# Patient Record
Sex: Male | Born: 1955 | Race: White | Hispanic: No | Marital: Married | Smoking: Never smoker
Health system: Southern US, Community
[De-identification: ages and names within clinical notes are randomized; demographics above are authoritative.]

## PROBLEM LIST (undated history)

## (undated) DIAGNOSIS — T7840XA Allergy, unspecified, initial encounter: Secondary | ICD-10-CM

## (undated) DIAGNOSIS — G4730 Sleep apnea, unspecified: Secondary | ICD-10-CM

## (undated) DIAGNOSIS — K219 Gastro-esophageal reflux disease without esophagitis: Secondary | ICD-10-CM

## (undated) HISTORY — DX: Sleep apnea, unspecified: G47.30

## (undated) HISTORY — DX: Gastro-esophageal reflux disease without esophagitis: K21.9

## (undated) HISTORY — PX: FRACTURE SURGERY: SHX138

## (undated) HISTORY — PX: VASCULAR SURGERY: SHX849

## (undated) HISTORY — DX: Allergy, unspecified, initial encounter: T78.40XA

## (undated) HISTORY — PX: OTHER SURGICAL HISTORY: SHX169

## (undated) HISTORY — PX: HERNIA REPAIR: SHX51

---

## 2005-01-03 HISTORY — PX: LEFT HEART CATH AND CORONARY ANGIOGRAPHY: CATH118249

## 2005-01-15 ENCOUNTER — Ambulatory Visit (HOSPITAL_COMMUNITY): Admission: RE | Admit: 2005-01-15 | Discharge: 2005-01-15 | Payer: Self-pay | Admitting: *Deleted

## 2005-09-02 ENCOUNTER — Encounter: Admission: RE | Admit: 2005-09-02 | Discharge: 2005-09-02 | Payer: Self-pay | Admitting: Urology

## 2005-09-16 ENCOUNTER — Ambulatory Visit (HOSPITAL_COMMUNITY): Admission: RE | Admit: 2005-09-16 | Discharge: 2005-09-16 | Payer: Self-pay | Admitting: Interventional Radiology

## 2005-10-15 ENCOUNTER — Encounter: Admission: RE | Admit: 2005-10-15 | Discharge: 2005-10-15 | Payer: Self-pay | Admitting: Interventional Radiology

## 2006-04-29 ENCOUNTER — Ambulatory Visit: Payer: Self-pay | Admitting: Family Medicine

## 2006-05-27 ENCOUNTER — Ambulatory Visit: Payer: Self-pay | Admitting: Family Medicine

## 2006-06-04 ENCOUNTER — Ambulatory Visit: Payer: Self-pay | Admitting: Family Medicine

## 2008-03-27 ENCOUNTER — Ambulatory Visit: Payer: Self-pay | Admitting: Internal Medicine

## 2008-03-27 DIAGNOSIS — I861 Scrotal varices: Secondary | ICD-10-CM

## 2008-03-27 DIAGNOSIS — D239 Other benign neoplasm of skin, unspecified: Secondary | ICD-10-CM | POA: Insufficient documentation

## 2008-03-28 ENCOUNTER — Ambulatory Visit: Payer: Self-pay | Admitting: Internal Medicine

## 2008-04-02 LAB — CONVERTED CEMR LAB
BUN: 16 mg/dL (ref 6–23)
Basophils Relative: 0.4 % (ref 0.0–1.0)
CO2: 30 meq/L (ref 19–32)
Calcium: 9.4 mg/dL (ref 8.4–10.5)
Chloride: 101 meq/L (ref 96–112)
Creatinine, Ser: 0.9 mg/dL (ref 0.4–1.5)
Eosinophils Relative: 5.6 % — ABNORMAL HIGH (ref 0.0–5.0)
Glucose, Bld: 102 mg/dL — ABNORMAL HIGH (ref 70–99)
Hemoglobin: 14.9 g/dL (ref 13.0–17.0)
Lymphocytes Relative: 34.1 % (ref 12.0–46.0)
MCHC: 35.1 g/dL (ref 30.0–36.0)
Monocytes Relative: 11.4 % (ref 3.0–12.0)
Neutro Abs: 2.4 10*3/uL (ref 1.4–7.7)
PSA: 0.54 ng/mL (ref 0.10–4.00)
RBC: 4.51 M/uL (ref 4.22–5.81)
TSH: 0.63 microintl units/mL (ref 0.35–5.50)
Total CHOL/HDL Ratio: 5
VLDL: 14 mg/dL (ref 0–40)
WBC: 5 10*3/uL (ref 4.5–10.5)

## 2011-02-20 NOTE — Cardiovascular Report (Signed)
NAME:  JEFFREN, DOMBEK NO.:  [id number]   MEDICAL RECORD NO.:  [id number]          PATIENT TYPE:  OIB   LOCATION:  2899                         FACILITY:  [facility]   PHYSICIAN:  Meade Maw, M.D.    DATE OF BIRTH:  09/17/1956   DATE OF PROCEDURE:  DATE OF DISCHARGE:                              CARDIAC CATHETERIZATION   REFERRING PHYSICIAN:  Leanne Chang, M.D.   PROCEDURE:  Cardiac catheterization.   INDICATION FOR PROCEDURE:  A 55 year old gentleman who presents for an  annual physical exam and was noted to have significant dizziness with any  physical activity.  Stress Cardiolite was performed and this revealed  changes consistent with inferior apical ischemia and an ejection fraction of  58%.  He was also noted to have ST changes consistent with ischemia on  stress test.   DESCRIPTION OF PROCEDURE:  After obtaining written informed consent, the  patient was brought to the cardiac catheterization lab in a post-absorptive  state.  Preop sedation was achieved, given Versed 2 mg IV, fentanyl 50 mcg  IV and the right groin was prepped and draped in the usual sterile fashion.  Local anesthesia was achieved using 1% Xylocaine.  A 6-French hemostasis  sheath was placed into the right femoral artery using the modified Seldinger  technique.  Selective coronary angiography was performed using a JL4, JR4  and Judkins catheter and multiple views were obtained.  All catheter  exchanges were made over a guidewire.  Single plane ventriculogram was  performed in the RAO position using the 6 French pigtail curved catheter.  There was no identifiable disease, the patient was transferred to the  holding area and good hemostasis was achieved using digital pressure.  There  were no immediate complications.   FINDINGS:  The aortic pressure was 132/72, LV pressure was 132/7, EDP was  12.  Single plane ventriculogram revealed normal wall motion, ejection  fraction of 65%.   Coronary angiography:  The left main coronary artery bifurcates into the  left anterior descending and circumflex vessel.  There is no significant  disease noted in the left main coronary artery.   Left anterior descending:  The left anterior descending gives rise to a  large D1 and small D2, goes on as an apical branch, there is no disease  noted in the left anterior descending or its branches.   Circumflex vessel:  The circumflex vessel is a moderate size vessel and  gives rise to trivial OM1 and OM2, large OM3, ends as an  AV groove vessel.  There is no disease noted in the circumflex or its branches.   Right coronary artery:  The right coronary artery is a large artery,  dominant for the posterior circulation and gives rise to two RV marginals,  PDA and a trifurcating PL branch.  There is no disease noted in the right  coronary artery or its branches.   FINAL IMPRESSION:  1.  Normal single plane ventriculogram, ejection fraction 65%.  2.  Normal coronary angiography.  3.  False/positive stress Cardiolite.  4.  Other etiologies for his dizziness should be ascertained.  HP/MEDQ  D:  01/15/2005  T:  01/15/2005  Job:  [id number]   cc:   Leanne Chang, M.D.  [address]  [place]  [place] [address]  Fax: [phone]

## 2013-12-26 ENCOUNTER — Other Ambulatory Visit: Payer: Self-pay | Admitting: Gastroenterology

## 2019-03-28 ENCOUNTER — Encounter (HOSPITAL_COMMUNITY): Payer: Self-pay | Admitting: Emergency Medicine

## 2019-03-28 ENCOUNTER — Emergency Department (HOSPITAL_COMMUNITY): Payer: Self-pay

## 2019-03-28 ENCOUNTER — Other Ambulatory Visit: Payer: Self-pay

## 2019-03-28 ENCOUNTER — Emergency Department (HOSPITAL_COMMUNITY)
Admission: EM | Admit: 2019-03-28 | Discharge: 2019-03-28 | Disposition: A | Discharge status: Home / Self Care | Payer: Self-pay | Attending: Emergency Medicine | Admitting: Emergency Medicine

## 2019-03-28 DIAGNOSIS — Z7982 Long term (current) use of aspirin: Secondary | ICD-10-CM | POA: Insufficient documentation

## 2019-03-28 DIAGNOSIS — R079 Chest pain, unspecified: Secondary | ICD-10-CM | POA: Insufficient documentation

## 2019-03-28 LAB — CBC
HCT: 44.9 % (ref 39.0–52.0)
Hemoglobin: 15.5 g/dL (ref 13.0–17.0)
MCH: 31.4 pg (ref 26.0–34.0)
MCHC: 34.5 g/dL (ref 30.0–36.0)
MCV: 91.1 fL (ref 80.0–100.0)
Platelets: 215 10*3/uL (ref 150–400)
RBC: 4.93 MIL/uL (ref 4.22–5.81)
RDW: 12.3 % (ref 11.5–15.5)
WBC: 5.8 10*3/uL (ref 4.0–10.5)
nRBC: 0 % (ref 0.0–0.2)

## 2019-03-28 LAB — BASIC METABOLIC PANEL
Anion gap: 8 (ref 5–15)
BUN: 16 mg/dL (ref 8–23)
CO2: 28 mmol/L (ref 22–32)
Calcium: 9.7 mg/dL (ref 8.9–10.3)
Chloride: 104 mmol/L (ref 98–111)
Creatinine, Ser: 1.13 mg/dL (ref 0.61–1.24)
GFR calc Af Amer: >60 mL/min (ref >60)
GFR calc non Af Amer: >60 mL/min (ref >60)
Glucose, Bld: 95 mg/dL (ref 70–99)
Potassium: 4.2 mmol/L (ref 3.5–5.1)
Sodium: 140 mmol/L (ref 135–145)

## 2019-03-28 LAB — TROPONIN I (HIGH SENSITIVITY)
Troponin I (High Sensitivity): 5 ng/L (ref <18)
Troponin I (High Sensitivity): 6 ng/L (ref <18)

## 2019-03-28 MED ORDER — SODIUM CHLORIDE 0.9% FLUSH: 3.0000 mL | Freq: Once | INTRAVENOUS | Status: DC

## 2019-03-28 NOTE — ED Notes (Signed)
Patient reports chest pain/pressure intermittent with varying degrees of pressure or pain, sometimes accompanied by lightheadedness.  Rest alleviates his pain, activity can increase is.  This has been going on for about a month or so.  His dr referred him to the ER for assessment

## 2019-03-28 NOTE — Discharge Instructions (Addendum)
The testing today does not show any severe problems.  Make sure you are getting plenty of rest, eating regularly and try to relax.  If your symptoms worsen, you can always return here for reevaluation.  Otherwise, follow-up with Dr. Ellyn Hack, next week, as scheduled.  He may want to investigate further with a stress test or other type of cardiac evaluation.

## 2019-03-28 NOTE — ED Notes (Signed)
Patient verbalizes understanding of discharge instructions. Opportunity for questioning and answers were provided. Armband removed by staff, pt discharged from ED.  

## 2019-03-28 NOTE — ED Provider Notes (Signed)
[facility] EMERGENCY DEPARTMENT Provider Note   CSN: [id number] Arrival date & time: 03/28/19  1003    History   Chief Complaint Chief Complaint  Patient presents with  . Chest Pain  . Back Pain    HPI Philip Crawford is a 63 y.o. male.     HPI   The patient is here because of a personal concern for cardiac problems manifested by intermittent episodes of pain in neck, chest, left shoulder and left arm.  He is also had multiple episodes of near syncope without frank syncope.  He had a single episode of dyspnea with exertion when climbing on a trail which was steep and the weather was hot.  He does not think he had chest pain when he was having the trouble breathing while walking.  No ongoing shortness of breath.  None of his symptoms are ongoing.  Symptoms have all been present for about a month.  He restarted his Prilosec a couple weeks ago because of concern for reflux disease recurrence.  He also schedule appointment with his dentist, to make sure the teeth were not a problem.  He scheduled appointment to see a cardiologist has not done that yet.  He states he had a cardiac cath, 15 years ago to follow-up on episodes of near syncope.  The catheterization was normal.  He does not take cardiac medications.  He does not know what his cholesterol is but it has been borderline high in the past, and he has tried to keep it low with diet, and exercise.  Because of the pandemic he has not exercised in the last several months.  He admits to stress, mentioning his daughter's death, 6 months ago.  He also recalls periods of chest discomfort with stressful times, and sometimes taps on his chest, to relieve the discomfort.  He had a fever several weeks ago with some achiness, and had a COVID test, 2 weeks ago which was negative.  No known sick contacts, currently.  He works as a Armed forces operational officer.  There are no other known modifying factors.  History reviewed. No pertinent past medical  history.  Patient Active Problem List   Diagnosis Date Noted  . 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 VASCULAR SURGERY          Home Medications    Prior to Admission medications   Medication Sig Start Date End Date Taking? Authorizing Provider  aspirin EC 81 MG tablet Take 81 mg by mouth daily.   Yes [provider]  omeprazole (PRILOSEC) 20 MG capsule Take 20 mg by mouth daily.   Yes [provider]  Ubiquinol 100 MG CAPS Take 100 mg by mouth daily.   Yes [provider]  VITAMIN D, CHOLECALCIFEROL, PO Take 5,000 Units by mouth daily.   Yes [provider]    Family History No family history on file.  Social History Social History   Tobacco Use  . Smoking status: Never Smoker  . Smokeless tobacco: Never Used  Substance Use Topics  . Alcohol use: Yes  . Drug use: Not Currently     Allergies   Patient has no allergy information on record.   Review of Systems Review of Systems  All other systems reviewed and are negative.    Physical Exam Updated Vital Signs BP 120/73 (BP Location: Right Arm)   Pulse 69   Temp 98.4 F (36.9  C) (Oral)   Resp 16   Ht 5\' 10"  (1.778 m)   Wt 72.6 kg   SpO2 98%   BMI 22.96 kg/m   Physical Exam Vitals signs and nursing note reviewed.  Constitutional:      General: He is in acute distress (Anxious).     Appearance: He is well-developed and normal weight. He is not ill-appearing, toxic-appearing or diaphoretic.  HENT:     Head: Normocephalic and atraumatic.     Right Ear: External ear normal.     Left Ear: External ear normal.     Mouth/Throat:     Mouth: Mucous membranes are moist.  Eyes:     Conjunctiva/sclera: Conjunctivae normal.     Pupils: Pupils are equal, round, and reactive to light.  Neck:     Musculoskeletal: Normal range of motion and neck supple.     Trachea: Phonation normal.  Cardiovascular:      Rate and Rhythm: Normal rate and regular rhythm.     Heart sounds: Normal heart sounds. No murmur. No friction rub.  Pulmonary:     Effort: Pulmonary effort is normal. No respiratory distress.     Breath sounds: Normal breath sounds. No stridor. No rhonchi.  Chest:     Chest wall: No tenderness.  Abdominal:     General: There is no distension.     Palpations: Abdomen is soft. There is no mass.     Tenderness: There is no abdominal tenderness.  Musculoskeletal: Normal range of motion.  Skin:    General: Skin is warm and dry.  Neurological:     Mental Status: He is alert and oriented to person, place, and time.     Cranial Nerves: No cranial nerve deficit.     Sensory: No sensory deficit.     Motor: No abnormal muscle tone.     Coordination: Coordination normal.  Psychiatric:        Mood and Affect: Mood normal.        Behavior: Behavior normal.        Thought Content: Thought content normal.        Judgment: Judgment normal.      ED Treatments / Results  Labs (all labs ordered are listed, but only abnormal results are displayed) Labs Reviewed  BASIC METABOLIC PANEL  CBC  TROPONIN I (HIGH SENSITIVITY)  TROPONIN I (HIGH SENSITIVITY)    EKG EKG Interpretation  Date/Time:  Tuesday March 28 2019 10:09:59 EDT Ventricular Rate:  80 PR Interval:  124 QRS Duration: 90 QT Interval:  358 QTC Calculation: 412 R Axis:   93 Text Interpretation:  Normal sinus rhythm with sinus arrhythmia Rightward axis Borderline ECG No old tracing to compare Confirmed by Daleen Bo [id number]) on 03/28/2019 11:27:35 AM   Radiology Dg Chest 2 View  Result Date: 03/28/2019 CLINICAL DATA:  LEFT side chest pain into LEFT shoulder, back, and arm for 1 month, slight numbness in LEFT arm and hand EXAM: CHEST - 2 VIEW COMPARISON:  01/15/2005 FINDINGS: Normal heart size, mediastinal contours, and pulmonary vascularity. Faint nodular densities from EKG leads. Lungs clear. No acute infiltrate, pleural  effusion or pneumothorax. Bones demineralized. IMPRESSION: No acute abnormalities. Electronically Signed   By: Lavonia Dana M.D.   On: 03/28/2019 10:45    Procedures Procedures (including critical care time)  Medications Ordered in ED Medications  sodium chloride flush (NS) 0.9 % injection 3 mL (has no administration in time range)     Initial Impression / Assessment and  Plan / ED Course  I have reviewed the triage vital signs and the nursing notes.  Pertinent labs & imaging results that were available during my care of the patient were reviewed by me and considered in my medical decision making (see chart for details).  Clinical Course as of Mar 27 1340  Tue Mar 28, 2019  1203 No infiltrate or CHF, images reviewed by me  DG Chest 2 View [EW]  1204 Normal  Troponin I (High Sensitivity) [EW]  1204 Normal  Basic metabolic panel [EW]  3710 Normal  CBC [EW]    Clinical Course User Index [EW] Daleen Bo, MD        Patient Vitals for the past 24 hrs:  BP Temp Temp src Pulse Resp SpO2 Height Weight  03/28/19 1250 120/73 98.4 F (36.9 C) Oral 69 - 98 % - -  03/28/19 1115 - - - - - - 5\' 10"  (1.778 m) 72.6 kg  03/28/19 1017 132/74 98.1 F (36.7 C) Oral 65 16 96 % - -    1:41 PM Reevaluation with update and discussion. After initial assessment and treatment, an updated evaluation reveals no change in clinical status, findings discussed with the patient and all questions were answered. Daleen Bo   Medical Decision Making: Patient with ongoing symptoms for about a month, which are nonspecific in nature and actually multifactorial.  He had screening test for cardiac, pulmonary, and physical examination, all of which are reassuring.  Patient is low risk for cardiac disease.  He has elevated cholesterol level in the past, but it has not been checked recently.  He is overall healthy, and robust.  He has short-term follow-up with cardiology planned for next week.  Doubt ACS, PE,  CVA, TIA, serious bacterial infection or metabolic instability.  There is no indication for further ED evaluation or intervention at this time.  CRITICAL CARE-no Performed by: Daleen Bo  Nursing Notes Reviewed/ Care Coordinated Applicable Imaging Reviewed Interpretation of Laboratory Data incorporated into ED treatment  The patient appears reasonably screened and/or stabilized for discharge and I doubt any other medical condition or other [facility] requiring further screening, evaluation, or treatment in the ED at this time prior to discharge.  Plan: Home Medications-continue usual; Home Treatments-rest, fluids; return here if the recommended treatment, does not improve the symptoms; Recommended follow up-PCP, PRN.  Follow-up with cardiology on 04/03/2019, as scheduled   Final Clinical Impressions(s) / ED Diagnoses   Final diagnoses:  Chest pain, unspecified type    ED Discharge Orders    None       Daleen Bo, MD 03/28/19 1341

## 2019-03-28 NOTE — ED Triage Notes (Signed)
Pt. Stated, I started having chest pain with some back pain it started Memorial weekend and its off and on. Last couple of days its been more . It has been in my shoulder also.

## 2019-03-31 ENCOUNTER — Telehealth: Payer: Self-pay | Admitting: Cardiology

## 2019-03-31 NOTE — Telephone Encounter (Signed)
LVM for pt, reminding him of his appt on 04-03-19 with Dr Ellyn Hack.

## 2019-04-03 ENCOUNTER — Other Ambulatory Visit: Payer: Self-pay

## 2019-04-03 ENCOUNTER — Encounter: Payer: Self-pay | Admitting: Cardiology

## 2019-04-03 ENCOUNTER — Telehealth: Payer: Self-pay | Admitting: *Deleted

## 2019-04-03 ENCOUNTER — Ambulatory Visit (INDEPENDENT_AMBULATORY_CARE_PROVIDER_SITE_OTHER): Payer: PRIVATE HEALTH INSURANCE | Admitting: Cardiology

## 2019-04-03 DIAGNOSIS — R002 Palpitations: Secondary | ICD-10-CM

## 2019-04-03 DIAGNOSIS — R079 Chest pain, unspecified: Secondary | ICD-10-CM | POA: Diagnosis not present

## 2019-04-03 DIAGNOSIS — R55 Syncope and collapse: Secondary | ICD-10-CM | POA: Diagnosis not present

## 2019-04-03 NOTE — Patient Instructions (Addendum)
Medication Instructions:   If you need a refill on your cardiac medications before your next appointment, please call your pharmacy.   Lab work:  If you have labs (blood work) drawn today and your tests are completely normal, you will receive your results only by: Marland Kitchen MyChart Message (if you have MyChart) OR . A paper copy in the mail If you have any lab test that is abnormal or we need to change your treatment, we will call you to review the results.  Testing/Procedures: Will schedule at [facility] has recommended that you wear an event monitor 14 DAYS - ZIO PATCH . Event monitors are medical devices that record the heart's electrical activity. Doctors most often [place] these monitors to diagnose arrhythmias. Arrhythmias are problems with the speed or rhythm of the heartbeat. The monitor is a small, portable device. You can wear one while you do your normal daily activities. This is usually used to diagnose what is causing palpitations/syncope (passing out).  AND  WILL BE SCHEDULE AT [facility] [address] WILL  NEED TO AC OVID TEST PRIOR TO TESTING WILL CALL YOU WHEN NEEDED. Your physician has requested that you have an exercise tolerance test. For further information please visit [email or web address]. Please also follow instruction sheet, as given.    Follow-Up: At [facility], you and your health needs are our priority.  As part of our continuing mission to provide you with exceptional heart care, we have created designated Provider Care Teams.  These Care Teams include your primary Cardiologist (physician) and Advanced Practice Providers (APPs -  Physician Assistants and Nurse Practitioners) who all work together to provide you with the care you need, when you need it. . You will need a follow up appointment in  3 months.  Please call our office 2 months in advance to schedule this appointment.  You may see Glenetta Hew, MD or one of the following  Advanced Practice Providers on your designated Care Team:   . Rosaria Ferries, PA-C . Jory Sims, DNP, ANP  Any Other Special Instructions Will Be Listed Below (If Applicable).

## 2019-04-03 NOTE — Progress Notes (Signed)
PCP: [facility] due to  Clinic Note: No chief complaint on file.   HPI: Philip Crawford is a 63 y.o. male with a PMH below who presents today for ER f/u - Chest pain/palpitations/near syncope evaluation.  Referred by EDP for PCP - Colon Branch, MD.  Rosalyn Charters was has not routinely followed up with his PCP.  Recent Hospitalizations:   ER 6/23: "personal concern for cardiac problems manifested by intermittent episodes of pain in neck, chest, left shoulder and left arm.  He is also had multiple episodes of near syncope without frank syncope.  He had a single episode of dyspnea with exertion when climbing on a trail which was steep and the weather was hot.  He does not think he had chest pain when he was having the trouble breathing while walking.  No ongoing shortness of breath.  None of his symptoms are ongoing.  Symptoms have all been present for about a month."  Studies Personally Reviewed - (if available, images/films reviewed: From Epic Chart or Care Everywhere)  Cardiolite ST 01/2005:  Perfusion defect c/w inferior apical ischemia, EF 58%.  He was also noted to have ST changes consistent with ischemia on stress test.  Cath 01/2005: Normal LV gram.  Normal coronary arteries.  False positive Cardiolite Stress Test  Interval History: Philip Crawford presents here today for cardiology evaluation really explaining/describing the same symptoms noted in the ER visit.  Very haphazard and random symptoms overall.  Is somewhat of a disjointed historian.  The most notable symptom he feels is a sense of jitteriness and dizziness with an irregular heart rate or beat sensation.  He says the heart rate is usually very fast, and also an u 8 nusual heartbeat.  It may or may not be associated with shortness of breath or chest discomfort.  These episodes happen randomly without any particular associates from a many activity.  However he does note that he does not have them when he  exercised at the gym just in the last couple days.  Another symptom he notices a focal area of discomfort reports to the left upper chest heading toward the shoulder that can sometimes go up into the shoulder or around the neck to the back between his shoulder blades.  Very rarely does appear to feel go down his arm as it has been on the shoulder first.  It does not seem to be necessarily associated with any particular activity, can happen at any given time --both at rest and with exertion.  For the most part, he does not have exertional dyspnea, did have a least 1 episode while hiking.  Remainder of cardiovascular review of symptoms as follows: No PND, orthopnea or edema.  No  syncope/near syncope, but he does have some lightheadedness and dizziness associated with the irregular heartbeat sensation.. No TIA/amaurosis fugax symptoms. No melena, hematochezia, hematuria, or epstaxis. No claudication.  He first noted having symptoms back on May 25 where he had a really weird dizzy sensation in excellent time he felt like he may pass out.  Since then he has had occasional lightheadedness and uneasy sensation in his chest.  On June 2 he had some short of breath and and and chest discomfort.  On the fourth he started having another dizzy spell again associated with some jaw tightness and the left arm pain left chest pain.  Several days later he had another bout of dizziness.  He had some sweats and affect fever  symptoms with dyspnea and was concerned about COVID-19.  He actually was tested and found to be negative.  When he was evaluated in the ER he was started on Prilosec and he says he is felt better since then.  He is hoping to see his GI doctor to discuss symptoms.  ROS: A comprehensive was performed. Review of Systems  Constitutional: Positive for fever (One episode noted above). Negative for malaise/fatigue.  HENT: Negative for congestion and nosebleeds.   Respiratory: Positive for shortness of  breath. Negative for cough.        Per HPI  Gastrointestinal: Positive for abdominal pain and heartburn. Negative for blood in stool, melena, nausea and vomiting.       He does note some abdominal fullness and acid taste in his mouth, that may very well be associated with the discomfort he feels in his chest.  He says that things have gotten a little bit better since starting omeprazole.  Genitourinary: Negative for frequency and hematuria.  Musculoskeletal: Positive for joint pain (Left shoulder).  Neurological: Positive for dizziness.  Psychiatric/Behavioral: Negative for memory loss. The patient is nervous/anxious (He seems quite anxious and nervous.  Has had a lot of stress going on.  A lot of this has to do with still grieving from his daughters suicide back last fall.  Also now the stress of COVID-19.). The patient does not have insomnia.        If not depression, still having some grief reaction from daughter's death.    I have reviewed and (if needed) personally updated the patient's problem list, medications, allergies, past medical and surgical history, social and family history.   History reviewed. No pertinent past medical history.  Past Surgical History:  Procedure Laterality Date   CARDIOLITE STRESS TEST  April thousand 6   Perfusion defect consistent with inferior apical ischemia coinciding with ischemic EKG changes on treadmill portion.  EF 58%.  Referred for cath --> FALSE POSITIVE   LEFT HEART CATH AND CORONARY ANGIOGRAPHY  01/2005   Normal coronary arteries.  Normal LV gram.--FALSE POSITIVE CARDIOLITE STRESS TEST   VASCULAR SURGERY      Current Meds  Medication Sig   aspirin EC 81 MG tablet Take 81 mg by mouth daily.   omeprazole (PRILOSEC) 20 MG capsule Take 20 mg by mouth daily.   Ubiquinol 100 MG CAPS Take 100 mg by mouth daily.   VITAMIN D, CHOLECALCIFEROL, PO Take 5,000 Units by mouth daily.    Not on File  Social History   Tobacco Use   Smoking  status: Never Smoker   Smokeless tobacco: Never Used  Substance Use Topics   Alcohol use: Yes   Drug use: Not Currently   Social History   Social History Narrative   E COVID-19 restrictions again, he routinely went to the gym several days a week, exercising relatively vigorously.  Also enjoys riding a bike and walking.      He and his wife are suffering from the loss of their young adult daughter who succumbed to complications from schizophrenia and ended up committing suicide in the fall 2019.  As result this is because a significant amount of stress.      He is also been under significant stress and work as he is a [employer] operational officer and is struggling with the financial stresses of COVID-19 restrictions.    family history includes AAA (abdominal aortic aneurysm) (age of onset: 34) in his father; CAD (age of onset: 23) in  his father; Healthy in his brother and sister; Other in his mother.  Wt Readings from Last 3 Encounters:  04/03/19 163 lb 3.2 oz (74 kg)  03/28/19 160 lb (72.6 kg)    PHYSICAL EXAM BP 132/78    Pulse (!) 55    Ht 5\' 10"  (1.778 m)    Wt 163 lb 3.2 oz (74 kg)    BMI 23.42 kg/m  Physical Exam  Constitutional: He is oriented to person, place, and time. He appears well-developed and well-nourished. No distress.  Well-groomed.  Overall anxious gentleman.  Healthy-appearing  HENT:  Head: Normocephalic and atraumatic.  Mouth/Throat: Oropharynx is clear and moist.  Eyes: Pupils are equal, round, and reactive to light. Conjunctivae and EOM are normal.  Neck: Normal range of motion. Neck supple. No JVD present.  Cardiovascular: Normal rate, regular rhythm, normal heart sounds and intact distal pulses. Exam reveals no gallop and no friction rub.  No murmur heard. Pulmonary/Chest: He exhibits tenderness (Left upper chest -- just inferior to the pectoralis insertion on the humerus).  Abdominal: Soft. Bowel sounds are normal. He exhibits no distension. There is no  abdominal tenderness. There is no rebound.  Musculoskeletal: Normal range of motion.        General: No edema.  Neurological: He is alert and oriented to person, place, and time. No cranial nerve deficit.  Skin: Skin is warm and dry.  Psychiatric: He has a normal mood and affect. His behavior is normal. Judgment and thought content normal.     Adult ECG Report  Rate: 55 ;  Rhythm: sinus bradycardia and Otherwise normal axis, intervals and durations.;   Narrative Interpretation: Normal EKG   Other studies Reviewed: Additional studies/ records that were reviewed today include:  Recent Labs:   Lab Results  Component Value Date   TSH 0.63 03/28/2008   Lab Results  Component Value Date   CREATININE 1.13 03/28/2019   BUN 16 03/28/2019   NA 140 03/28/2019   K 4.2 03/28/2019   CL 104 03/28/2019   CO2 28 03/28/2019   Lab Results  Component Value Date   WBC 5.8 03/28/2019   HGB 15.5 03/28/2019   HCT 44.9 03/28/2019   MCV 91.1 03/28/2019   PLT 215 03/28/2019   Lab Results  Component Value Date   CHOL 210 (HH) 03/28/2008   HDL 41.7 03/28/2008   LDLDIRECT 143.4 03/28/2008   TRIG 69 03/28/2008   CHOLHDL 5.0 CALC 03/28/2008    ASSESSMENT / PLAN: Problem List Items Addressed This Visit    Palpitations    Hard to tell if he is describing just irregular heartbeats or fast heart rates, at least one episode of associated near syncope.  Plan: 14-day ZIO patch monitor.      Relevant Orders   EKG 12-Lead (Completed)   LONG TERM MONITOR (3-14 DAYS)   EXERCISE TOLERANCE TEST (ETT)   Near syncope    One episode of nearly passing out.  Hard to tell if this is related to.  Multiple different symptoms that he is noting.  With him having the sensation of irregular heartbeats of fast heart rates associated with it I would like to have him wear a 14day event monitor (ZIO patch) just to exclude any arrhythmia.  Hold off on treatment until we see what we find      Relevant Orders   EKG  12-Lead (Completed)   LONG TERM MONITOR (3-14 DAYS)   EXERCISE TOLERANCE TEST (ETT)   Chest pain with low  risk for cardiac etiology    Somewhat atypical, unusual chest discomfort in the left upper chest radiating to her shoulder but not necessarily associated with exertion.  I am a little leery of a stress test because he had a history of abnormal GXT in the past, but feel like this is the best starting test.  If this is abnormal, I would probably err on the side of a coronary CTA as opposed to a nuclear imaging test based on the fact that he had a false positive Cardiolite in the past.      Relevant Orders   EKG 12-Lead (Completed)   LONG TERM MONITOR (3-14 DAYS)   EXERCISE TOLERANCE TEST (ETT)      I spent a total of 28 minutes with the patient and chart review. >  50% of the time was spent in direct patient consultation.  Additional 15 minutes spent on chart review.  Current medicines are reviewed at length with the patient today.  (+/- concerns) n/a The following changes have been made:  none  Patient Instructions  Medication Instructions:   If you need a refill on your cardiac medications before your next appointment, please call your pharmacy.   Lab work:  If you have labs (blood work) drawn today and your tests are completely normal, you will receive your results only by:  [place] (if you have MyChart) OR  A paper copy in the mail If you have any lab test that is abnormal or we need to change your treatment, we will call you to review the results.  Testing/Procedures: Will schedule at [place] has recommended that you wear an event monitor 14 DAYS - ZIO PATCH . Event monitors are medical devices that record the hearts electrical activity. Doctors most often [place] these monitors to diagnose arrhythmias. Arrhythmias are problems with the speed or rhythm of the heartbeat. The monitor is a small, portable device. You can wear one  while you do your normal daily activities. This is usually used to diagnose what is causing palpitations/syncope (passing out).  AND  WILL BE SCHEDULE AT [place] [address] WILL  NEED TO AC OVID TEST PRIOR TO TESTING WILL CALL YOU WHEN NEEDED. Your physician has requested that you have an exercise tolerance test. For further information please visit [email or web address]. Please also follow instruction sheet, as given.    Follow-Up: At [facility], you and your health needs are our priority.  As part of our continuing mission to provide you with exceptional heart care, we have created designated Provider Care Teams.  These Care Teams include your primary Cardiologist (physician) and Advanced Practice Providers (APPs -  Physician Assistants and Nurse Practitioners) who all work together to provide you with the care you need, when you need it.  You will need a follow up appointment in  3 months.  Please call our office 2 months in advance to schedule this appointment.  You may see Glenetta Hew, MD or one of the following Advanced Practice Providers on your designated Care Team:    Rosaria Ferries, PA-C  Jory Sims, DNP, ANP  Any Other Special Instructions Will Be Listed Below (If Applicable).    Studies Ordered:   Orders Placed This Encounter  Procedures   LONG TERM MONITOR (3-14 DAYS)   EXERCISE TOLERANCE TEST (ETT)   EKG 12-Lead      Glenetta Hew, M.D., M.S. Interventional Cardiologist   Pager # [phone] Phone # [phone]  [address] [place], [place] [address]   Thank you for choosing Heartcare at [facility]!!

## 2019-04-03 NOTE — Telephone Encounter (Signed)
14 day ZIO XT long term holter monitor to be mailed to patients home.  Instructions reviewed briefly as they are included in the monitor kit.

## 2019-04-04 ENCOUNTER — Encounter: Payer: Self-pay | Admitting: Cardiology

## 2019-04-04 NOTE — Assessment & Plan Note (Signed)
One episode of nearly passing out.  Hard to tell if this is related to.  Multiple different symptoms that he is noting.  With him having the sensation of irregular heartbeats of fast heart rates associated with it I would like to have him wear a 14day event monitor (ZIO patch) just to exclude any arrhythmia.  Hold off on treatment until we see what we find

## 2019-04-04 NOTE — Assessment & Plan Note (Signed)
Somewhat atypical, unusual chest discomfort in the left upper chest radiating to her shoulder but not necessarily associated with exertion.  I am a little leery of a stress test because he had a history of abnormal GXT in the past, but feel like this is the best starting test.  If this is abnormal, I would probably err on the side of a coronary CTA as opposed to a nuclear imaging test based on the fact that he had a false positive Cardiolite in the past.

## 2019-04-04 NOTE — Assessment & Plan Note (Signed)
Hard to tell if he is describing just irregular heartbeats or fast heart rates, at least one episode of associated near syncope.  Plan: 14-day ZIO patch monitor.

## 2019-04-13 ENCOUNTER — Telehealth: Payer: Self-pay | Admitting: Cardiology

## 2019-04-13 ENCOUNTER — Encounter: Payer: Self-pay | Admitting: *Deleted

## 2019-04-13 NOTE — Progress Notes (Signed)
Patient ID: Philip Crawford, male   DOB: Jan 26, 1956, 63 y.o.   MRN: [id number] Patient cancelled ZIO XT long term holter monitor.  Patient instructed to mail monitor back to [facility] per RN.  [employer] to be notified patient cancelled.

## 2019-04-13 NOTE — Telephone Encounter (Signed)
New Message    Patient wants to postpone heart monitor test due to his problem being GI related and he has some procedures coming up.  Please call patient to discuss.

## 2019-04-13 NOTE — Telephone Encounter (Signed)
Spoke to patient  , patient states he decide to postpone doing monitor- having issue with Gi . Patient thinks that is the culprit. Patient just received - monitor box -   he states he may want to do try later, but not now-- patient states he has a high deductible so he would like to cancel  Monitor.  RN informed him to send monitor back  And writing that he changes mind and to cancel .  RN INFORMED PATIENT SEND BACK AS SOON AS POSSIBLE SO HE WOULD NOT BE BILLED.  PATIENT STATES WILL CONTACT OFFICE BACK IF NEEDED.

## 2019-04-14 ENCOUNTER — Other Ambulatory Visit: Payer: Self-pay | Admitting: Gastroenterology

## 2019-04-14 DIAGNOSIS — R1013 Epigastric pain: Secondary | ICD-10-CM

## 2019-04-14 DIAGNOSIS — K219 Gastro-esophageal reflux disease without esophagitis: Secondary | ICD-10-CM

## 2019-04-17 ENCOUNTER — Ambulatory Visit
Admission: RE | Admit: 2019-04-17 | Discharge: 2019-04-17 | Disposition: A | Discharge status: Home / Self Care | Payer: No Typology Code available for payment source | Source: Ambulatory Visit | Attending: Gastroenterology | Admitting: Gastroenterology

## 2019-04-17 ENCOUNTER — Other Ambulatory Visit: Payer: Self-pay

## 2019-04-17 DIAGNOSIS — K219 Gastro-esophageal reflux disease without esophagitis: Secondary | ICD-10-CM

## 2019-04-17 DIAGNOSIS — R1013 Epigastric pain: Secondary | ICD-10-CM

## 2019-06-30 ENCOUNTER — Ambulatory Visit: Payer: No Typology Code available for payment source | Admitting: Cardiology

## 2019-07-22 IMAGING — US ULTRASOUND ABDOMEN LIMITED
1 series · 14 of 25 positions shown · non-contrast
Comparison: None.

CLINICAL DATA: Upper abdominal pain

EXAM:
ULTRASOUND ABDOMEN LIMITED RIGHT UPPER QUADRANT

[Series 1: ultrasound abdomen limited · 0.15mm/px · 14 of 47 slices shown]
[im 1/47]
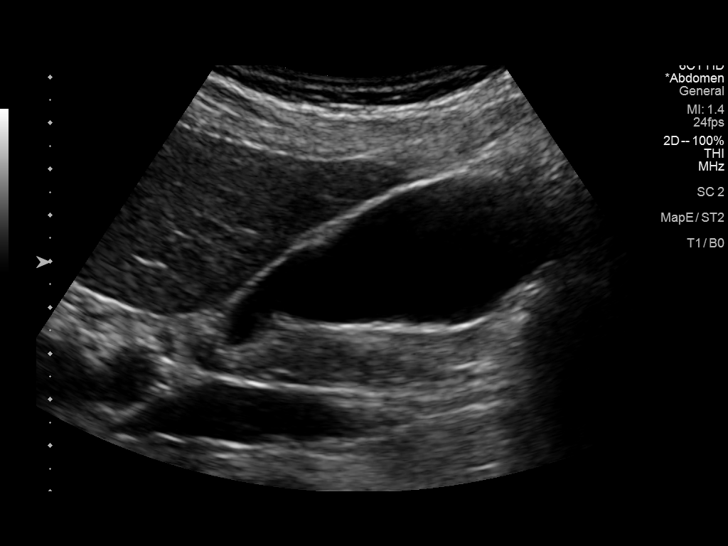
[im 4/47]
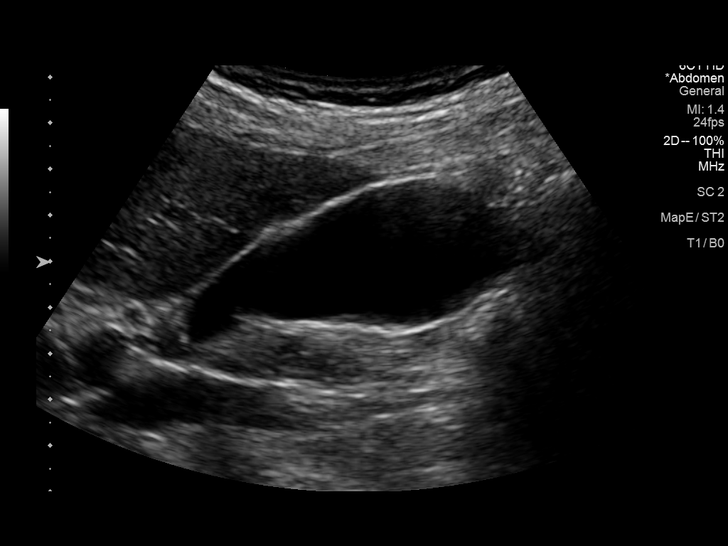
[im 8/47]
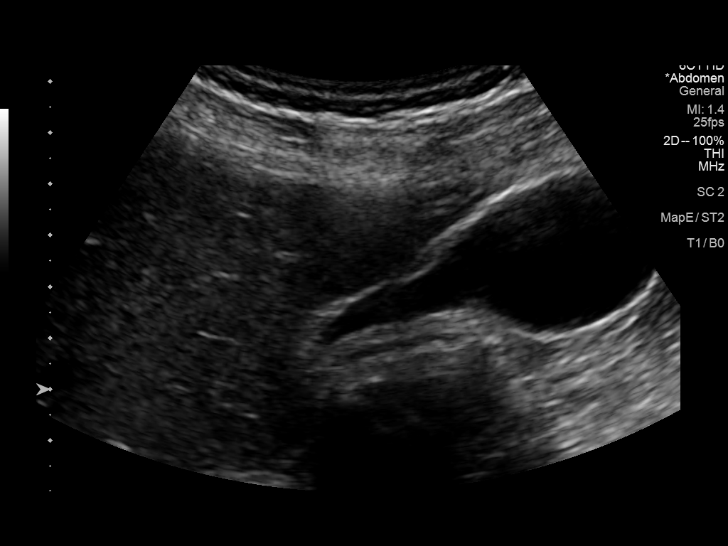
[im 12/47]
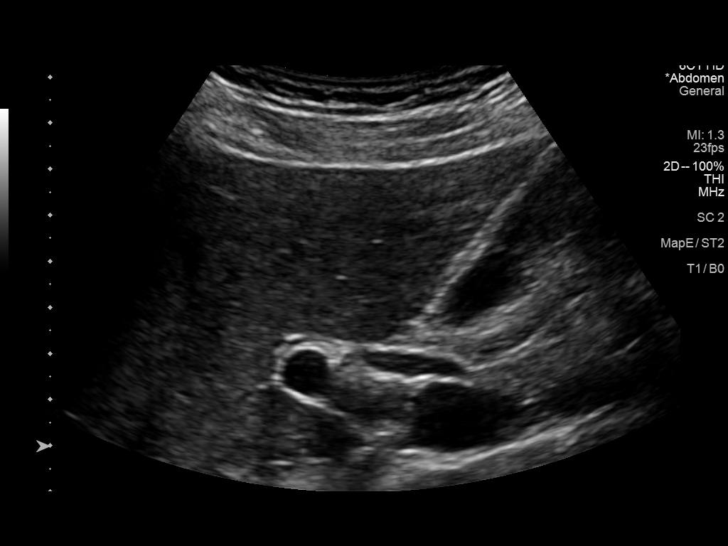
[im 16/47]
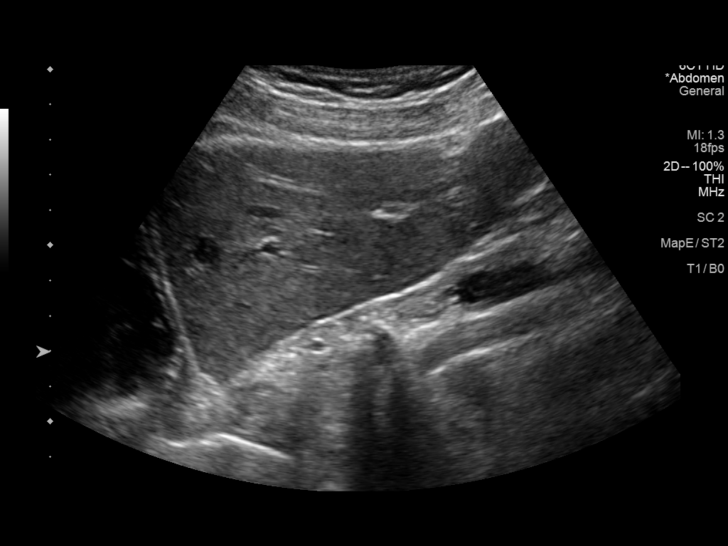
[im 18/47]
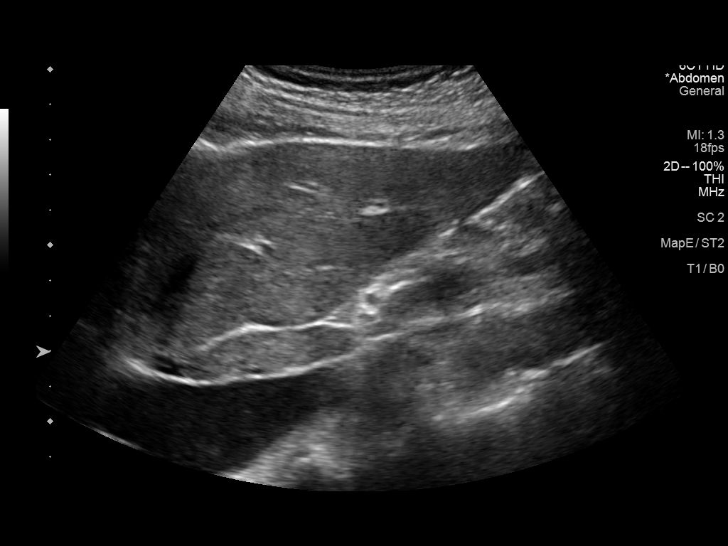
[im 22/47]
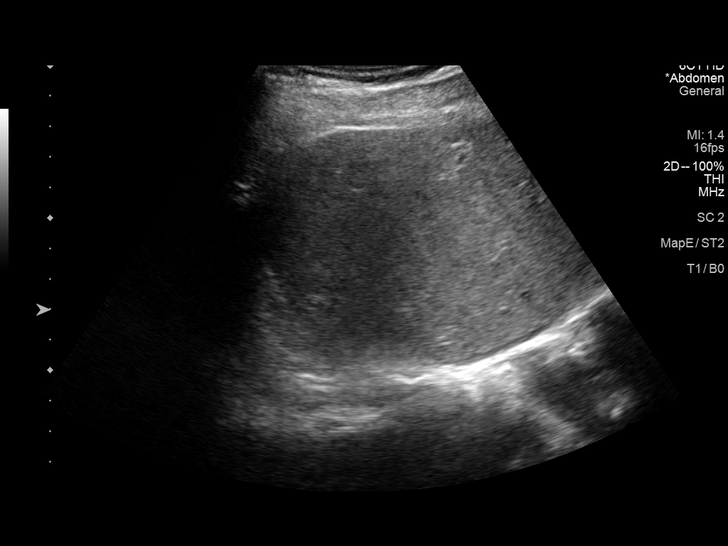
[im 25/47]
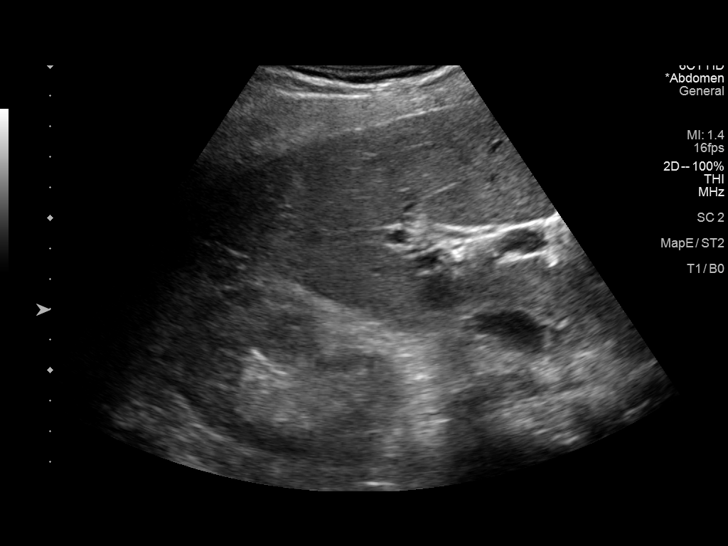
[im 29/47]
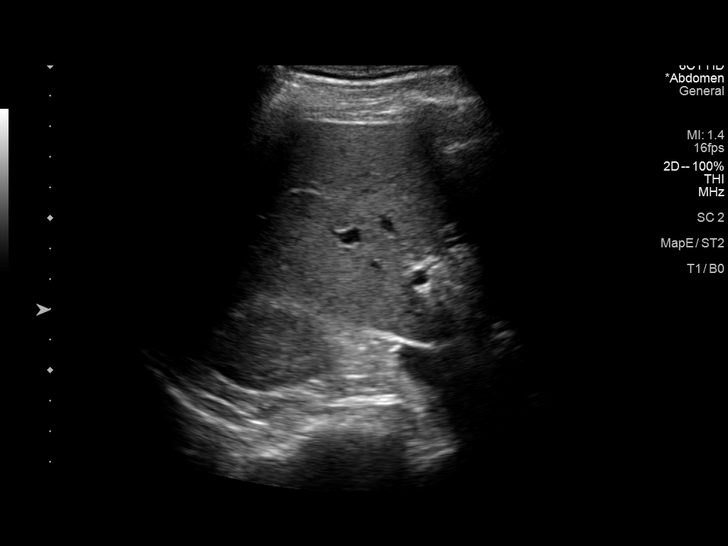
[im 31/47]
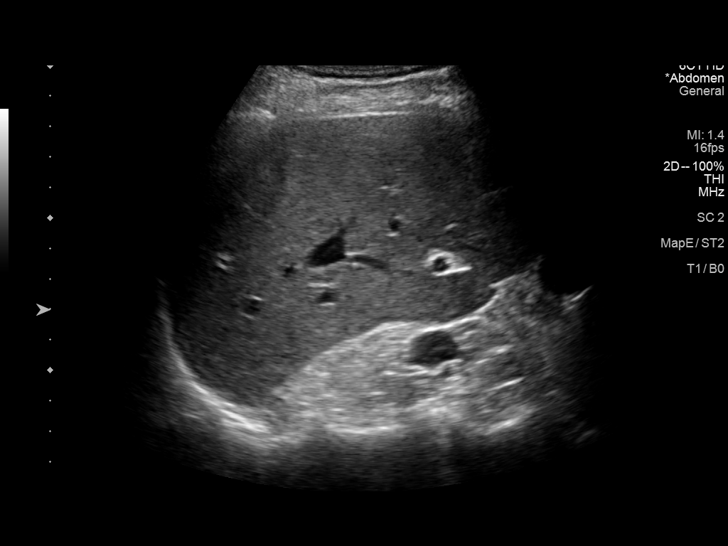
[im 35/47]
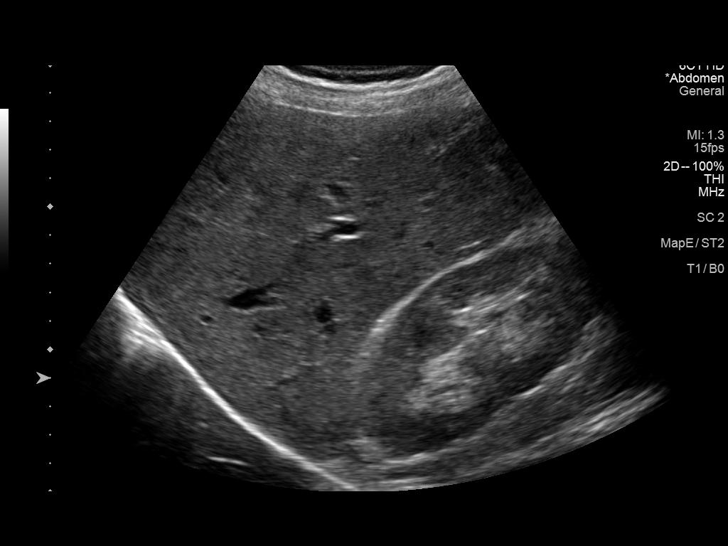
[im 39/47]
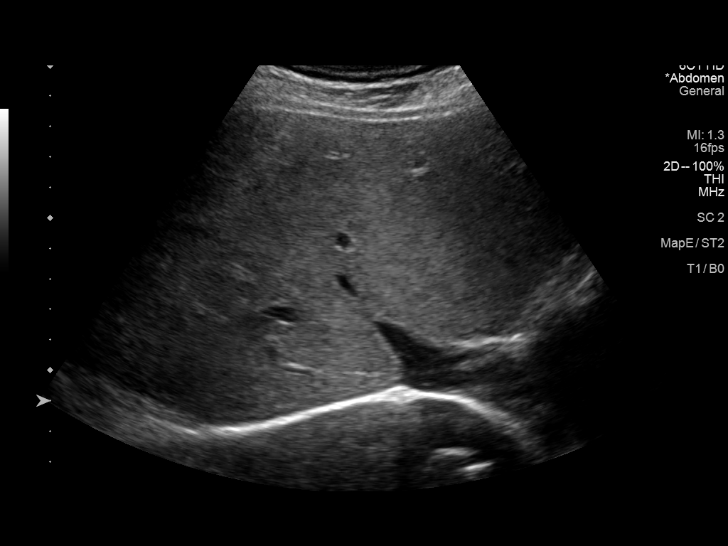
[im 43/47]
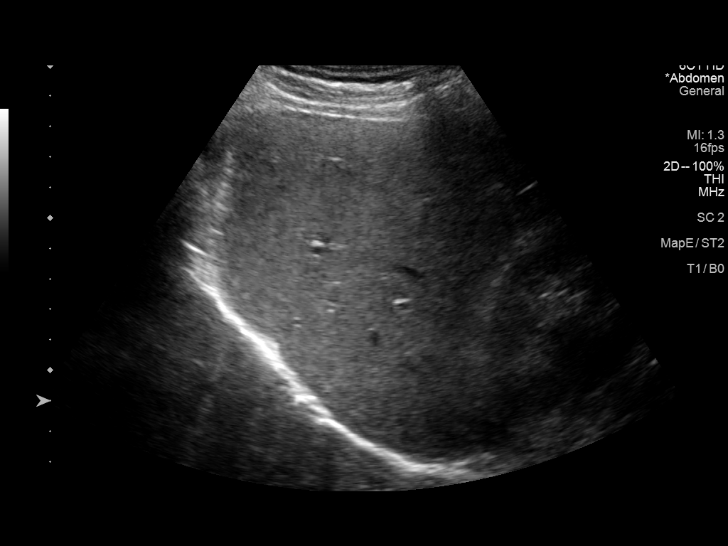
[im 47/47]
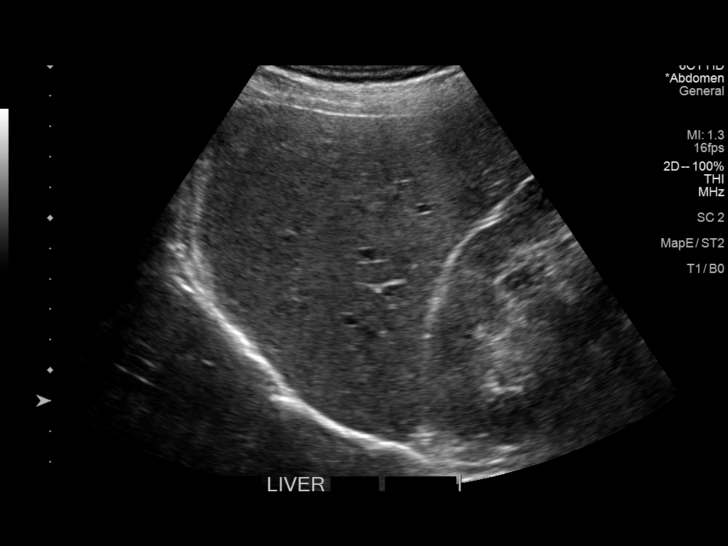

[14 of 25 positions shown; findings below may reference images not displayed]

FINDINGS: Gallbladder:

No gallstones or wall thickening visualized. There is no
pericholecystic fluid. No sonographic Murphy sign noted by
sonographer.

Common bile duct:

Diameter: 4 mm. No intrahepatic or extrahepatic biliary duct
dilatation.

Liver:

No focal lesion identified. Within normal limits in parenchymal
echogenicity. Portal vein is patent on color Doppler imaging with
normal direction of blood flow towards the liver.
IMPRESSION: Study within normal limits.

## 2021-11-25 DIAGNOSIS — M545 Low back pain, unspecified: Secondary | ICD-10-CM | POA: Diagnosis not present

## 2021-12-08 DIAGNOSIS — E785 Hyperlipidemia, unspecified: Secondary | ICD-10-CM | POA: Diagnosis not present

## 2021-12-08 DIAGNOSIS — Z0000 Encounter for general adult medical examination without abnormal findings: Secondary | ICD-10-CM | POA: Diagnosis not present

## 2021-12-08 DIAGNOSIS — Z125 Encounter for screening for malignant neoplasm of prostate: Secondary | ICD-10-CM | POA: Diagnosis not present

## 2022-01-21 DIAGNOSIS — R748 Abnormal levels of other serum enzymes: Secondary | ICD-10-CM | POA: Diagnosis not present

## 2024-05-04 ENCOUNTER — Ambulatory Visit: Payer: Self-pay | Admitting: Family Medicine

## 2024-05-11 ENCOUNTER — Ambulatory Visit: Admitting: Family Medicine

## 2024-05-24 ENCOUNTER — Ambulatory Visit (INDEPENDENT_AMBULATORY_CARE_PROVIDER_SITE_OTHER): Admitting: Family Medicine

## 2024-05-24 ENCOUNTER — Encounter: Payer: Self-pay | Admitting: Family Medicine

## 2024-05-24 VITALS — BP 139/76 | HR 61 | Temp 97.8°F | Resp 18 | Ht 70.5 in | Wt 166.8 lb

## 2024-05-24 DIAGNOSIS — E782 Mixed hyperlipidemia: Secondary | ICD-10-CM

## 2024-05-24 DIAGNOSIS — E559 Vitamin D deficiency, unspecified: Secondary | ICD-10-CM

## 2024-05-24 DIAGNOSIS — Z860100 Personal history of colon polyps, unspecified: Secondary | ICD-10-CM | POA: Insufficient documentation

## 2024-05-24 DIAGNOSIS — K219 Gastro-esophageal reflux disease without esophagitis: Secondary | ICD-10-CM

## 2024-05-24 DIAGNOSIS — N529 Male erectile dysfunction, unspecified: Secondary | ICD-10-CM | POA: Diagnosis not present

## 2024-05-24 DIAGNOSIS — R0681 Apnea, not elsewhere classified: Secondary | ICD-10-CM

## 2024-05-24 DIAGNOSIS — E785 Hyperlipidemia, unspecified: Secondary | ICD-10-CM | POA: Insufficient documentation

## 2024-05-24 DIAGNOSIS — Z85828 Personal history of other malignant neoplasm of skin: Secondary | ICD-10-CM

## 2024-05-24 DIAGNOSIS — Z7689 Persons encountering health services in other specified circumstances: Secondary | ICD-10-CM

## 2024-05-24 MED ORDER — TADALAFIL 5 MG PO TABS: 5.0000 mg | ORAL_TABLET | Freq: Every day | ORAL | 3 refills | Status: AC

## 2024-05-24 NOTE — Progress Notes (Signed)
 Assessment & Plan   Assessment/Plan:    Assessment & Plan Establishing care Establishing care after a period without a regular physician. Medical history includes hyperlipidemia, erectile dysfunction, vitamin D deficiency, GERD, and a dermatology-managed fungal infection. He has not undergone blood work in approximately two years and seeks a physical exam and blood work. - Schedule a physical exam with fasting blood work - Obtain past medical records to address care gaps  Suspected sleep apnea Reports difficulty breathing when supine, persisting for years. Avoids supine position during sleep. Prefers alternatives to CPAP. Discussed sleep apnea's potential cardiovascular impact and non-invasive home sleep test. Alternative treatments include mouth guards, positional therapy, and implantable devices. - Refer to sleep medicine for evaluation and possible home sleep test  Erectile dysfunction Currently uses tadalafil  as needed, experimenting with dosages. Experiences side effects with higher doses. Considering 5 mg daily, which is effective and does not require cycling off. - Prescribe tadalafil  5 mg daily  Hyperlipidemia Borderline high cholesterol managed with omega-3 fatty acids, ubiquinol, and lifestyle modifications. Physically active with normal BMI and blood pressure. - Monitor cholesterol levels with upcoming blood work  Vitamin D deficiency Vitamin D deficiency managed with supplementation to maintain normal levels.  Gastroesophageal reflux disease GERD associated with stress and past chest discomfort.      Medications Discontinued During This Encounter  Medication Reason   acetaminophen-codeine (TYLENOL #3) 300-30 MG tablet    aspirin EC 81 MG tablet    omeprazole (PRILOSEC) 20 MG capsule     Return in about 1 month (around 06/24/2024) for physical (fasting labs).        Subjective:   Encounter date: 05/24/2024  RICHAR Crawford is a 67 y.o. male who has  Benign neoplasm of skin; VARICOCELE; Palpitations; Near syncope; Chest pain with low risk for cardiac etiology; Erectile dysfunction; Gastroesophageal reflux disease without esophagitis; History of basal cell carcinoma; History of colonic polyps; HLD (hyperlipidemia); and Vitamin D deficiency on their problem list..   He  has a past medical history of Allergy, GERD (gastroesophageal reflux disease), and Sleep apnea.Philip Crawford   He presents with chief complaint of Establish Care (HM due- vaccinations (colonoscopy done 2025 records requested from [facility]) ), Shortness of Breath (Pt c/o of SOB when lying down supine while sleeping. ), and Medication Refill (RX request for Tadalafil  5MG  ) .   Discussed the use of AI scribe software for clinical note transcription with the patient, who gave verbal consent to proceed.  History of Present Illness Philip Crawford is a 68 year old male who presents to establish care.  He is establishing care as this office is closer to his home. He has not had a regular doctor since his previous doctor, Dr. Tita, and has not had blood work in approximately two years. He recently retired and obtained Medicare last year.  He has a history of hyperlipidemia, managed with omega-3 fatty acids, ubiquinol, and lifestyle modifications. He describes his cholesterol levels as 'borderline' and has not had recent blood work to assess current levels.  He experiences symptoms of GERD and LPR, which he associates with stress. He has a history of heart discomfort attributed to indigestion or LPR. 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  He has erectile dysfunction and uses tadalafil . He has experimented with as-needed dosing and experiences side effects with higher doses.  He has a history of vitamin D deficiency and has been taking supplements since  being diagnosed. He is unsure of the exact  dosage but believes it is beneficial.  He reports experiencing shortness of breath when lying flat, which has been ongoing for years. He manages this by sleeping on his side and avoids using a CPAP machine. He can nap in a recliner without issues.  He engages in physical activity, playing pickleball and working out a few times a week. He experiences some fatigue and joint aches, which he attributes to dehydration and physical activity.  He has a history of basal cell carcinoma, status post excision, and follows up with dermatology for a fungal infection, for which he uses ketoconazole cream.  He reports a history of elevated blood pressure readings at medical appointments, which he attributes to 'white coat syndrome.' At home, his blood pressure is typically around 120/80 mmHg.     ROS  Past Surgical History:  Procedure Laterality Date   CARDIOLITE STRESS TEST  April thousand 6   Perfusion defect consistent with inferior apical ischemia coinciding with ischemic EKG changes on treadmill portion.  EF 58%.  Referred for cath --> FALSE POSITIVE   FRACTURE SURGERY  1972   HERNIA REPAIR  1961   LEFT HEART CATH AND CORONARY ANGIOGRAPHY  01/2005   Normal coronary arteries.  Normal LV gram.--FALSE POSITIVE CARDIOLITE STRESS TEST   VASCULAR SURGERY      Outpatient Medications Prior to Visit  Medication Sig Dispense Refill   ketoconazole (NIZORAL) 2 % cream SMARTSIG:1 Application Topical 1 to 2 Times Daily     Omega-3 Fatty Acids (FISH OIL) 1200 MG CAPS 2 capules Orally Once a day     Ubiquinol 100 MG CAPS Take 100 mg by mouth daily.     VITAMIN D, CHOLECALCIFEROL, PO Take 5,000 Units by mouth daily.     acetaminophen-codeine (TYLENOL #3) 300-30 MG tablet Take 1 tablet by mouth every 6 (six) hours as needed.     aspirin EC 81 MG tablet Take 81 mg by mouth daily.     omeprazole (PRILOSEC) 20 MG capsule Take 20 mg by mouth daily.     No facility-administered medications prior to visit.     Family History  Problem Relation Age of Onset   Other Mother        No CAD or diabetes   Arthritis Mother    Cancer Mother    Hypertension Mother    AAA (abdominal aortic aneurysm) Father 58       Died from ruptured AAA   CAD Father 54       CABG in his 49s   Cancer Father    Heart disease Father    Healthy Sister    Healthy Brother     Social History   Socioeconomic History   Marital status: Married    Spouse name: Not on file   Number of children: Not on file   Years of education: Not on file   Highest education level: Some college, no degree  Occupational History   Not on file  Tobacco Use   Smoking status: Never   Smokeless tobacco: Never  Vaping Use   Vaping status: Never Used  Substance and Sexual Activity   Alcohol use: Not Currently   Drug use: Never   Sexual activity: Yes  Other Topics Concern   Not on file  Social History Narrative   E COVID-19 restrictions again, he routinely went to the gym several days a week, exercising relatively vigorously.  Also enjoys riding a bike and walking.  He and his wife are suffering from the loss of their young adult daughter who succumbed to complications from schizophrenia and ended up committing suicide in the fall 2019.  As result this is because a significant amount of stress.      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   Social Drivers of Corporate investment banker Strain: Low Risk  (05/09/2024)   Overall Financial Resource Strain (CARDIA)    Difficulty of Paying Living Expenses: Not hard at all  Food Insecurity: No Food Insecurity (05/09/2024)   Hunger Vital Sign    Worried About Running Out of Food in the Last Year: Never true    Ran Out of Food in the Last Year: Never true  Transportation Needs: No Transportation Needs (05/09/2024)   PRAPARE - Administrator, Civil Service  (Medical): No    Lack of Transportation (Non-Medical): No  Physical Activity: Sufficiently Active (05/09/2024)   Exercise Vital Sign    Days of Exercise per Week: 6 days    Minutes of Exercise per Session: 60 min  Stress: No Stress Concern Present (05/09/2024)   [employer] of Occupational Health - Occupational Stress Questionnaire    Feeling of Stress: Not at all  Social Connections: Moderately Integrated (05/09/2024)   Social Connection and Isolation Panel    Frequency of Communication with Friends and Family: More than three times a week    Frequency of Social Gatherings with Friends and Family: More than three times a week    Attends Religious Services: Never    Database administrator or Organizations: Yes    Attends Engineer, structural: 1 to 4 times per year    Marital Status: Married  Catering manager Violence: Not on file                                                                                                  Objective:  Physical Exam: BP 139/76 (BP Location: Left Arm, Patient Position: Sitting, Cuff Size: Normal)   Pulse 61   Temp 97.8 F (36.6 C) (Temporal)   Resp 18   Ht 5' 10.5 (1.791 m)   Wt 166 lb 12.8 oz (75.7 kg)   SpO2 98%   BMI 23.60 kg/m    Physical Exam GENERAL: Alert, cooperative, well developed, no acute distress HEENT: Normocephalic, normal oropharynx, moist mucous membranes CHEST: Clear to auscultation bilaterally, no wheezes, rhonchi, or crackles CARDIOVASCULAR: Normal heart rate and rhythm, S1 and S2 normal without murmurs ABDOMEN: Soft, non-tender, non-distended, without organomegaly, normal bowel sounds EXTREMITIES: No cyanosis or edema NEUROLOGICAL: Cranial nerves grossly intact, moves all extremities without gross motor or sensory deficit   Physical Exam  No results found.  No results found for this or any previous visit (from the past 2160 hours).      Beverley Adine Hummer, MD, MS

## 2024-05-25 ENCOUNTER — Telehealth: Payer: Self-pay

## 2024-05-25 NOTE — Telephone Encounter (Signed)
 Pharmacy Patient Advocate Encounter   Received notification from [employer] that prior authorization for Tadalafil  5MG  tablets  is required/requested.   Insurance verification completed.   The patient is insured through [facility] [employer]IC[employer] .   Per test claim: PA required; PA submitted to above mentioned insurance via [employer] Key/confirmation #/EOC [id number] Status is pending

## 2024-05-26 NOTE — Telephone Encounter (Signed)
 Pharmacy Patient Advocate Encounter  Received notification from [facility] MEDIC[facility] that Prior Authorization for TADALAFIL  5MG  TABLETS has been DENIED.  See denial reason below. No denial letter attached in CMM. Will attach denial letter to Media tab once received.   Denied. This drug used for the treatment of sexual dysfunction is not a covered benefit under the Part D coverage and we do not offer supplemental benefit. Section 1927(d)(2) of the Social Security Act (the Act) permits the exclusion of certain drugs or classes of drugs from coverage under Part D.    PA #/Case ID/Reference #: [id number]

## 2024-06-27 NOTE — Telephone Encounter (Signed)
 Copied from CRM [id number]. Topic: Appointments - Appointment Scheduling >> Jun 27, 2024 10:22 AM Devaughn RAMAN wrote: Patient is calling to schedule an sleep consultation appointment, returning [facility] phone call. Advised patient PCC's will follow up regarding appointment. Patient was thankful and verbalized understanding.   Routing to front staff

## 2024-06-28 NOTE — Telephone Encounter (Signed)
 Attempted to call patient, no one answered so I left a voicemail.

## 2024-06-30 NOTE — Telephone Encounter (Signed)
 Called patient and got him scheduled 11/18 w/ Pawar. NFN.

## 2024-07-05 ENCOUNTER — Encounter: Admitting: Family Medicine

## 2024-07-11 ENCOUNTER — Ambulatory Visit: Admitting: Family Medicine

## 2024-07-11 VITALS — BP 129/78 | HR 55 | Temp 96.4°F | Ht 70.5 in | Wt 165.6 lb

## 2024-07-11 DIAGNOSIS — Z23 Encounter for immunization: Secondary | ICD-10-CM

## 2024-07-11 DIAGNOSIS — E782 Mixed hyperlipidemia: Secondary | ICD-10-CM

## 2024-07-11 DIAGNOSIS — E559 Vitamin D deficiency, unspecified: Secondary | ICD-10-CM | POA: Diagnosis not present

## 2024-07-11 DIAGNOSIS — R972 Elevated prostate specific antigen [PSA]: Secondary | ICD-10-CM

## 2024-07-11 DIAGNOSIS — R5382 Chronic fatigue, unspecified: Secondary | ICD-10-CM

## 2024-07-11 DIAGNOSIS — N529 Male erectile dysfunction, unspecified: Secondary | ICD-10-CM

## 2024-07-11 DIAGNOSIS — Z125 Encounter for screening for malignant neoplasm of prostate: Secondary | ICD-10-CM

## 2024-07-11 LAB — COMPREHENSIVE METABOLIC PANEL WITH GFR
ALT: 18 U/L (ref 0–53)
AST: 23 U/L (ref 0–37)
Albumin: 4.6 g/dL (ref 3.5–5.2)
Alkaline Phosphatase: 52 U/L (ref 39–117)
BUN: 20 mg/dL (ref 6–23)
CO2: 28 meq/L (ref 19–32)
Calcium: 9.6 mg/dL (ref 8.4–10.5)
Chloride: 101 meq/L (ref 96–112)
Creatinine, Ser: 0.9 mg/dL (ref 0.40–1.50)
GFR: 89.09 mL/min (ref >60.00)
Glucose, Bld: 105 mg/dL — ABNORMAL HIGH (ref 70–99)
Potassium: 4.2 meq/L (ref 3.5–5.1)
Sodium: 137 meq/L (ref 135–145)
Total Bilirubin: 0.6 mg/dL (ref 0.2–1.2)
Total Protein: 7.2 g/dL (ref 6.0–8.3)

## 2024-07-11 LAB — CBC WITH DIFFERENTIAL/PLATELET
Basophils Absolute: 0 K/uL (ref 0.0–0.1)
Basophils Relative: 0.6 % (ref 0.0–3.0)
Eosinophils Absolute: 0.5 K/uL (ref 0.0–0.7)
Eosinophils Relative: 8.1 % — ABNORMAL HIGH (ref 0.0–5.0)
HCT: 45.3 % (ref 39.0–52.0)
Hemoglobin: 15.4 g/dL (ref 13.0–17.0)
Lymphocytes Relative: 27 % (ref 12.0–46.0)
Lymphs Abs: 1.6 K/uL (ref 0.7–4.0)
MCHC: 34.1 g/dL (ref 30.0–36.0)
MCV: 93.8 fl (ref 78.0–100.0)
Monocytes Absolute: 0.5 K/uL (ref 0.1–1.0)
Monocytes Relative: 9.1 % (ref 3.0–12.0)
Neutro Abs: 3.3 K/uL (ref 1.4–7.7)
Neutrophils Relative %: 55.2 % (ref 43.0–77.0)
Platelets: 187 K/uL (ref 150.0–400.0)
RBC: 4.83 Mil/uL (ref 4.22–5.81)
RDW: 13.2 % (ref 11.5–15.5)
WBC: 5.9 K/uL (ref 4.0–10.5)

## 2024-07-11 LAB — PSA: PSA: 0.69 ng/mL (ref 0.10–4.00)

## 2024-07-11 LAB — LIPID PANEL
Cholesterol: 232 mg/dL — ABNORMAL HIGH (ref 0–200)
HDL: 53.7 mg/dL (ref >39.00)
LDL Cholesterol: 159 mg/dL — ABNORMAL HIGH (ref 0–99)
NonHDL: 177.91
Total CHOL/HDL Ratio: 4
Triglycerides: 97 mg/dL (ref 0.0–149.0)
VLDL: 19.4 mg/dL (ref 0.0–40.0)

## 2024-07-11 LAB — VITAMIN D 25 HYDROXY (VIT D DEFICIENCY, FRACTURES): VITD: 40.01 ng/mL (ref 30.00–100.00)

## 2024-07-11 LAB — TSH: TSH: 0.86 u[IU]/mL (ref 0.35–5.50)

## 2024-07-11 NOTE — Progress Notes (Signed)
 Assessment  Assessment/Plan:  Assessment and Plan Assessment & Plan Adult Wellness Visit Annual physical examination with no acute concerns. Discussed general health and wellness, including diet and exercise. 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 Symptoms include cramping and nerve-like pain in the fourth and fifth toes, exacerbated by narrow footwear and prolonged standing. Differential includes Morton's neuroma, arthritis, or malalignment. - Consider referral to podiatrist if symptoms persist or worsen - Advise wearing shoes with a wider toe box to alleviate symptoms  Erectile dysfunction Erectile dysfunction managed with tadalafil  5 mg daily. - Continue tadalafil  5 mg daily  Hyperlipidemia Hyperlipidemia managed with lifestyle modifications and omega-3 fatty acid supplementation. - Continue current management with diet, exercise, and omega-3 supplementation  Vitamin D deficiency Vitamin D deficiency managed with over-the-counter calcifediol, 5000 IU daily. - Continue current vitamin D supplementation - Check vitamin D levels with lab work     There are no discontinued medications.  Patient Counseling(The following topics were reviewed and/or handout was given):  -Nutrition: Stressed importance of moderation in sodium/caffeine intake, saturated fat and cholesterol, caloric balance, sufficient intake of fresh fruits, vegetables, and fiber.  -Stressed the importance of regular exercise.   -Substance Abuse: Discussed cessation/primary prevention of tobacco, alcohol, or other drug use; driving or other dangerous activities under the influence; availability of treatment for abuse.    -Injury prevention: Discussed safety belts, safety helmets, smoke detector, smoking near bedding or upholstery.   -Sexuality: Discussed sexually transmitted diseases, partner selection, use of condoms, avoidance of unintended pregnancy and contraceptive alternatives.   -Dental health: Discussed importance of regular tooth brushing, flossing, and dental visits.  -Health maintenance and immunizations reviewed. Please refer to Health maintenance section.  No follow-ups on file.        Subjective:   Encounter date: 07/11/2024  Chief Complaint  Patient presents with   Annual Exam    Pt presents today for a physical. Pt has been fasting, & would like to discuss if he needs his shingles shot     Discussed the use of AI scribe software for clinical note transcription with the patient, who gave verbal consent to proceed.  History of Present Illness Philip Crawford is a 68 year old male who presents for an annual physical exam.  Vitamin d deficiency - Takes over-the-counter calcifediol, 5000 IU daily - Interested in checking vitamin D levels  Erectile dysfunction - Manages symptoms with tadalafil , 5 mg daily  Hyperlipidemia - Manages with lifestyle modifications including diet and exercise - Takes omega-3 fatty acid supplementation  Lower extremity pain - Recently experienced significant leg and foot pain after increased walking during a trip to [place] [employer] [place] - Typically walks about 30 minutes daily and plays pickleball - Pain was more severe than usual due to extended walking  Neuropathic pain in left foot - Long-standing nerve-like pain in the fourth and fifth toes of the left foot - Attributed to a past crushing injury - Pain described as cramp or shock-like sensation - Exacerbated by tight shoes or prolonged standing  Fatigue and testosterone concerns - Occasional fatigue - Interested in checking testosterone levels due to concerns about low levels at his  age  Liver function testing - History of a past liver function test  showing low levels, which normalized upon retesting       07/11/2024    9:35 AM 05/24/2024    4:08 PM  Depression screen PHQ 2/9  Decreased Interest 0 0  Down, Depressed, Hopeless 0 0  PHQ - 2 Score 0 0  Altered sleeping 0 0  Tired, decreased energy 0 1  Change in appetite 0 0  Feeling bad or failure about yourself  0 0  Trouble concentrating 0 0  Moving slowly or fidgety/restless 0 0  Suicidal thoughts 0 0  PHQ-9 Score 0 1  Difficult doing work/chores Not difficult at all Not difficult at all       07/11/2024    9:35 AM 05/24/2024    4:08 PM  GAD 7 : Generalized Anxiety Score  Nervous, Anxious, on Edge 0 0  Control/stop worrying 0 0  Worry too much - different things 0 0  Trouble relaxing 0 0  Restless 0 0  Easily annoyed or irritable 0 0  Afraid - awful might happen 0 0  Total GAD 7 Score 0 0  Anxiety Difficulty Not difficult at all Not difficult at all    Health Maintenance Due  Topic Date Due   Medicare Annual Wellness (AWV)  Never done   Hepatitis C Screening  Never done      PMH:  The following were reviewed and entered/updated in epic: Past Medical History:  Diagnosis Date   Allergy    seasonal   GERD (gastroesophageal reflux disease)    occasional   Sleep apnea     Patient Active Problem List   Diagnosis Date Noted   Erectile dysfunction 05/24/2024   Gastroesophageal reflux disease without esophagitis 05/24/2024   History of basal cell carcinoma 05/24/2024   History of colonic polyps 05/24/2024   HLD (hyperlipidemia) 05/24/2024   Vitamin D deficiency 05/24/2024   Palpitations 04/03/2019   Near syncope 04/03/2019   Chest pain with low risk for cardiac etiology 04/03/2019   Benign neoplasm of skin 03/27/2008   VARICOCELE 03/27/2008    Past Surgical History:  Procedure Laterality Date   CARDIOLITE STRESS TEST  April thousand 6   Perfusion defect consistent with inferior  apical ischemia coinciding with ischemic EKG changes on treadmill portion.  EF 58%.  Referred for cath --> FALSE POSITIVE   FRACTURE SURGERY  1972   HERNIA REPAIR  1961   LEFT HEART CATH AND CORONARY ANGIOGRAPHY  01/2005   Normal coronary arteries.  Normal LV gram.--FALSE POSITIVE CARDIOLITE STRESS TEST   VASCULAR SURGERY      Family History  Problem Relation Age of Onset   Other Mother        No CAD or diabetes   Arthritis Mother    Cancer Mother    Hypertension Mother    AAA (abdominal aortic aneurysm) Father 20       Died from ruptured AAA   CAD Father 69       CABG in his 33s   Cancer Father    Heart disease Father    Healthy Sister    Healthy Brother     Medications- reviewed and updated Outpatient Medications Prior to Visit  Medication Sig Dispense Refill   Coenzyme Q10 (Q-SORB CO Q-10) 100 MG capsule 1 capsule with a meal Orally Once a day occas     ketoconazole (NIZORAL) 2 % cream SMARTSIG:1 Application Topical 1 to 2 Times Daily     Magnesium 100 MG CAPS 1 tablet with a meal Orally  Once a day     Omega-3 Fatty Acids (FISH OIL) 1200 MG CAPS 2 capules Orally Once a day     tadalafil  (CIALIS ) 5 MG tablet Take 1 tablet (5 mg total) by mouth daily. 90 tablet 3   Ubiquinol 100 MG CAPS Take 100 mg by mouth daily.     VITAMIN D, CHOLECALCIFEROL, PO Take 5,000 Units by mouth daily.     No facility-administered medications prior to visit.    Allergies  Allergen Reactions   Dust Mite Extract Hives   Fluticasone     Social History   Socioeconomic History   Marital status: Married    Spouse name: Not on file   Number of children: Not on file   Years of education: Not on file   Highest education level: Some college, no degree  Occupational History   Not on file  Tobacco Use   Smoking status: Never   Smokeless tobacco: Never  Vaping Use   Vaping status: Never Used  Substance and Sexual Activity   Alcohol use: Not Currently   Drug use: Never   Sexual activity:  Yes  Other Topics Concern   Not on file  Social History Narrative   E COVID-19 restrictions again, he routinely went to the gym several days a week, exercising relatively vigorously.  Also enjoys riding a bike and walking.      He and his wife are suffering from the loss of their young adult daughter who succumbed to complications from schizophrenia and ended up committing suicide in the fall 2019.  As result this is because a significant amount of stress.      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   Social Drivers of Corporate investment banker Strain: Low Risk  (05/09/2024)   Overall Financial Resource Strain (CARDIA)    Difficulty of Paying Living Expenses: Not hard at all  Food Insecurity: No Food Insecurity (05/09/2024)   Hunger Vital Sign    Worried About Running Out of Food in the Last Year: Never true    Ran Out of Food in the Last Year: Never true  Transportation Needs: No Transportation Needs (05/09/2024)   PRAPARE - Administrator, Civil Service (Medical): No    Lack of Transportation (Non-Medical): No  Physical Activity: Sufficiently Active (05/09/2024)   Exercise Vital Sign    Days of Exercise per Week: 6 days    Minutes of Exercise per Session: 60 min  Stress: No Stress Concern Present (05/09/2024)   [employer] of Occupational Health - Occupational Stress Questionnaire    Feeling of Stress: Not at all  Social Connections: Moderately Integrated (05/09/2024)   Social Connection and Isolation Panel    Frequency of Communication with Friends and Family: More than three times a week    Frequency of Social Gatherings with Friends and Family: More than three times a week    Attends Religious Services: Never    Database administrator or Organizations: Yes    Attends Banker Meetings: 1 to 4 times per year    Marital Status: Married            Objective:  Physical Exam: BP 129/78 (BP Location: Left Arm, Patient Position: Sitting, Cuff Size: Normal)   Pulse (!) 55   Temp (!) 96.4 F (35.8 C) (Temporal)   Ht 5' 10.5 (1.791  m)   Wt 165 lb 9.6 oz (75.1 kg)   SpO2 99%   BMI 23.43 kg/m   Body mass index is 23.43 kg/m. Wt Readings from Last 3 Encounters:  07/11/24 165 lb 9.6 oz (75.1 kg)  05/24/24 166 lb 12.8 oz (75.7 kg)  04/03/19 163 lb 3.2 oz (74 kg)    Physical Exam GENERAL: Alert, cooperative, well developed, no acute distress. HEENT: Normocephalic, normal oropharynx, moist mucous membranes, minimal cerumen in right ear, extraocular movements intact. CHEST: Clear to auscultation bilaterally, no wheezes, rhonchi, or crackles. CARDIOVASCULAR: Normal heart rate and rhythm, S1 and S2 normal without murmurs. ABDOMEN: Soft, non-tender, non-distended, without organomegaly, normal bowel sounds. EXTREMITIES: No cyanosis, edema, or leg swelling. NEUROLOGICAL: Cranial nerves grossly intact, moves all extremities without gross motor or sensory deficit.  Physical Exam      Prior labs:   No results found for this or any previous visit (from the past 2160 hours).  Lab Results  Component Value Date   CHOL 210 (HH) 03/28/2008   Lab Results  Component Value Date   HDL 41.7 03/28/2008   No results found for: [facility] Lab Results  Component Value Date   TRIG 69 03/28/2008   Lab Results  Component Value Date   CHOLHDL 5.0 CALC 03/28/2008   Lab Results  Component Value Date   LDLDIRECT 143.4 03/28/2008    Last metabolic panel Lab Results  Component Value Date   GLUCOSE 95 03/28/2019   NA 140 03/28/2019   K 4.2 03/28/2019   CL 104 03/28/2019   CO2 28 03/28/2019   BUN 16 03/28/2019   CREATININE 1.13 03/28/2019   GFRNONAA >60 03/28/2019   CALCIUM 9.7 03/28/2019   AST 25 03/28/2008   ALT 21 03/28/2008   ANIONGAP 8 03/28/2019    No results found for: HGBA1C  Last CBC Lab Results  Component  Value Date   WBC 5.8 03/28/2019   HGB 15.5 03/28/2019   HCT 44.9 03/28/2019   MCV 91.1 03/28/2019   MCH 31.4 03/28/2019   RDW 12.3 03/28/2019   PLT 215 03/28/2019    Lab Results  Component Value Date   TSH 0.63 03/28/2008    Lab Results  Component Value Date   PSA 0.54 03/28/2008    Last vitamin D No results found for: 25OHVITD2, 25OHVITD3, VD25OH  No results found for: COLORU, CLARITYU, GLUCOSEUR, BILIRUBINUR, KETONESU, SPECGRAV, RBCUR, PHUR, PROTEINUR, UROBILINOGEN, LEUKOCYTESUR  No results found for: LABMICR, MICROALBUR   At today's visit, we discussed treatment options, associated risk and benefits, and engage in counseling as needed.  Additionally the following were reviewed: Past medical records, past medical and surgical history, family and social background, as well as relevant laboratory results, imaging findings, and specialty notes, where applicable.  This message was generated using dictation software, and as a result, it may contain unintentional typos or errors.  Nevertheless, extensive effort was made to accurately convey at the pertinent aspects of the patient visit.    There may have been are other unrelated non-urgent complaints, but due to the busy schedule and the amount of time already spent with him, time does not permit to address these issues at today's visit. Another appointment may have or has been requested to review these additional issues.     Arvella Hummer, MD, MS

## 2024-07-13 ENCOUNTER — Ambulatory Visit: Payer: Self-pay | Admitting: Family Medicine

## 2024-07-13 DIAGNOSIS — E782 Mixed hyperlipidemia: Secondary | ICD-10-CM

## 2024-07-13 LAB — MICROSCOPIC EXAMINATION
Bacteria, UA: NONE SEEN
Casts: NONE SEEN /LPF
Epithelial Cells (non renal): NONE SEEN /HPF (ref 0–10)
RBC, Urine: NONE SEEN /HPF (ref 0–2)
WBC, UA: NONE SEEN /HPF (ref 0–5)

## 2024-07-13 LAB — UA/M W/RFLX CULTURE, ROUTINE
Bilirubin, UA: NEGATIVE
Glucose, UA: NEGATIVE
Ketones, UA: NEGATIVE
Leukocytes,UA: NEGATIVE
Nitrite, UA: NEGATIVE
Protein,UA: NEGATIVE
RBC, UA: NEGATIVE
Specific Gravity, UA: 1.013 (ref 1.005–1.030)
Urobilinogen, Ur: 0.2 mg/dL (ref 0.2–1.0)
pH, UA: 8.5 — ABNORMAL HIGH (ref 5.0–7.5)

## 2024-07-13 LAB — TESTOSTERONE,FREE AND TOTAL
Testosterone, Free: 5.4 pg/mL — AB (ref 6.6–18.1)
Testosterone: 561 ng/dL (ref 264–916)

## 2024-07-13 MED ORDER — ROSUVASTATIN CALCIUM 10 MG PO TABS: 10.0000 mg | ORAL_TABLET | Freq: Every day | ORAL | 3 refills | Status: AC

## 2024-08-22 ENCOUNTER — Ambulatory Visit

## 2024-09-15 ENCOUNTER — Ambulatory Visit (INDEPENDENT_AMBULATORY_CARE_PROVIDER_SITE_OTHER)

## 2024-09-15 VITALS — Ht 71.0 in | Wt 165.0 lb

## 2024-09-15 DIAGNOSIS — Z0000 Encounter for general adult medical examination without abnormal findings: Secondary | ICD-10-CM

## 2024-09-15 NOTE — Patient Instructions (Signed)
 Mr. Theiler,  Thank you for taking the time for your Medicare Wellness Visit. I appreciate your continued commitment to your health goals. Please review the care plan we discussed, and feel free to reach out if I can assist you further.  Please note that Annual Wellness Visits do not include a physical exam. Some assessments may be limited, especially if the visit was conducted virtually. If needed, we may recommend an in-person follow-up with your provider.  Ongoing Care Seeing your primary care provider every 3 to 6 months helps us  monitor your health and provide consistent, personalized care.   Referrals If a referral was made during today's visit and you haven't received any updates within two weeks, please contact the referred provider directly to check on the status.  Recommended Screenings:  Health Maintenance  Topic Date Due   Medicare Annual Wellness Visit  Never done   COVID-19 Vaccine (1) Never done   Hepatitis C Screening  Never done   Zoster (Shingles) Vaccine (2 of 2) 09/05/2024   Flu Shot  01/02/2025*   Pneumococcal Vaccine for age over 95 (1 of 1 - PCV) 07/11/2025*   DTaP/Tdap/Td vaccine (4 - Td or Tdap) 08/14/2026   Colon Cancer Screening  02/04/2034   Meningitis B Vaccine  Aged Out  *Topic was postponed. The date shown is not the original due date.       09/14/2024    8:02 AM  Advanced Directives  Does Patient Have a Medical Advance Directive? Yes  Type of Estate Agent of [place];Living will  Copy of Healthcare Power of Attorney in Chart? No - copy requested    Vision: Annual vision screenings are recommended for early detection of glaucoma, cataracts, and diabetic retinopathy. These exams can also reveal signs of chronic conditions such as diabetes and high blood pressure.  Dental: Annual dental screenings help detect early signs of oral cancer, gum disease, and other conditions linked to overall health, including heart disease and  diabetes.  Please see the attached documents for additional preventive care recommendations.

## 2024-09-15 NOTE — Progress Notes (Signed)
 Chief Complaint  Patient presents with   Medicare Wellness     Subjective:   Philip Crawford is a 68 y.o. male who presents for a Medicare Annual Wellness Visit.  Visit info / Clinical Intake: Medicare Wellness Visit Type:: Initial Annual Wellness Visit Persons participating in visit and providing information:: patient Medicare Wellness Visit Mode:: Video Since this visit was completed virtually, some vitals may be partially provided or unavailable. Missing vitals are due to the limitations of the virtual format.: Documented vitals are patient reported If Telephone or Video please confirm:: I connected with patient using audio/video enable telemedicine. I verified patient identity with two identifiers, discussed telehealth limitations, and patient agreed to proceed. Patient Location:: home Provider Location:: office Interpreter Needed?: No Pre-visit prep was completed: yes AWV questionnaire completed by patient prior to visit?: yes Date:: 09/14/24 Living arrangements:: (Patient-Rptd) lives with spouse/significant other Patient's Overall Health Status Rating: (Patient-Rptd) excellent Typical amount of pain: (Patient-Rptd) some Does pain affect daily life?: (Patient-Rptd) no Are you currently prescribed opioids?: no  Dietary Habits and Nutritional Risks How many meals a day?: (Patient-Rptd) 3 Eats fruit and vegetables daily?: (Patient-Rptd) yes Most meals are obtained by: (Patient-Rptd) preparing own meals In the last 2 weeks, have you had any of the following?: none Diabetic:: no  Functional Status Activities of Daily Living (to include ambulation/medication): (Patient-Rptd) Independent Ambulation: (Patient-Rptd) Independent Medication Administration: (Patient-Rptd) Independent Home Management (perform basic housework or laundry): (Patient-Rptd) Independent Manage your own finances?: (Patient-Rptd) yes Primary transportation is: (Patient-Rptd) driving Concerns about  vision?: no *vision screening is required for WTM* Concerns about hearing?: no  Fall Screening Falls in the past year?: (Patient-Rptd) 0 Number of falls in past year: 0 Was there an injury with Fall?: 0 Fall Risk Category Calculator: 0 Patient Fall Risk Level: Low Fall Risk  Fall Risk Patient at Risk for Falls Due to: No Fall Risks Fall risk Follow up: Falls evaluation completed; Falls prevention discussed  Home and Transportation Safety: All rugs have non-skid backing?: (Patient-Rptd) yes All stairs or steps have railings?: (Patient-Rptd) yes Grab bars in the bathtub or shower?: (!) (Patient-Rptd) no Have non-skid surface in bathtub or shower?: (!) (Patient-Rptd) no Good home lighting?: (Patient-Rptd) yes Regular seat belt use?: (Patient-Rptd) yes Hospital stays in the last year:: (Patient-Rptd) no  Cognitive Assessment Difficulty concentrating, remembering, or making decisions? : (Patient-Rptd) no Will 6CIT or Mini Cog be Completed: yes What year is it?: 0 points What month is it?: 0 points Give patient an address phrase to remember (5 components): [address] About what time is it?: 0 points Count backwards from 20 to 1: 0 points Say the months of the year in reverse: 2 points Repeat the address phrase from earlier: 0 points 6 CIT Score: 2 points  Advance Directives (For Healthcare) Does Patient Have a Medical Advance Directive?: Yes Type of Advance Directive: Healthcare Power of [place]; Living will Copy of Healthcare Power of Attorney in Chart?: No - copy requested Copy of Living Will in Chart?: No - copy requested  Reviewed/Updated  Reviewed/Updated: Reviewed All (Medical, Surgical, Family, Medications, Allergies, Care Teams, Patient Goals)    Allergies (verified) Dust mite extract and Fluticasone   Current Medications (verified) Outpatient Encounter Medications as of 09/15/2024  Medication Sig   Coenzyme Q10 (Q-SORB CO Q-10) 100 MG capsule 1  capsule with a meal Orally Once a day occas   ketoconazole (NIZORAL) 2 % cream SMARTSIG:1 Application Topical 1 to 2 Times Daily  Magnesium 100 MG CAPS 1 tablet with a meal Orally Once a day   Omega-3 Fatty Acids (FISH OIL) 1200 MG CAPS 2 capules Orally Once a day   tadalafil  (CIALIS ) 5 MG tablet Take 1 tablet (5 mg total) by mouth daily.   Ubiquinol 100 MG CAPS Take 100 mg by mouth daily.   VITAMIN D , CHOLECALCIFEROL, PO Take 5,000 Units by mouth daily.   rosuvastatin  (CRESTOR ) 10 MG tablet Take 1 tablet (10 mg total) by mouth daily. (Patient not taking: Reported on 09/15/2024)   No facility-administered encounter medications on file as of 09/15/2024.    History: Past Medical History:  Diagnosis Date   Allergy    seasonal   GERD (gastroesophageal reflux disease)    occasional   Sleep apnea    Past Surgical History:  Procedure Laterality Date   CARDIOLITE STRESS TEST  April thousand 6   Perfusion defect consistent with inferior apical ischemia coinciding with ischemic EKG changes on treadmill portion.  EF 58%.  Referred for cath --> FALSE POSITIVE   FRACTURE SURGERY  1972   HERNIA REPAIR  1961   LEFT HEART CATH AND CORONARY ANGIOGRAPHY  01/2005   Normal coronary arteries.  Normal LV gram.--FALSE POSITIVE CARDIOLITE STRESS TEST   VASCULAR SURGERY     Family History  Problem Relation Age of Onset   Other Mother        No CAD or diabetes   Arthritis Mother    Cancer Mother    Hypertension Mother    AAA (abdominal aortic aneurysm) Father 24       Died from ruptured AAA   CAD Father 63       CABG in his 50s   Cancer Father    Heart disease Father    Healthy Sister    Healthy Brother    Social History   Occupational History   Not on file  Tobacco Use   Smoking status: Never   Smokeless tobacco: Never  Vaping Use   Vaping status: Never Used  Substance and Sexual Activity   Alcohol use: Not Currently   Drug use: Never   Sexual activity: Yes   Tobacco  Counseling Counseling given: Not Answered  SDOH Screenings   Food Insecurity: No Food Insecurity (09/14/2024)  Housing: Low Risk (09/14/2024)  Transportation Needs: No Transportation Needs (09/14/2024)  Utilities: Not At Risk (09/15/2024)  Alcohol Screen: Low Risk (09/14/2024)  Depression (PHQ2-9): Low Risk (09/15/2024)  Financial Resource Strain: Low Risk (09/14/2024)  Physical Activity: Sufficiently Active (09/14/2024)  Social Connections: Moderately Integrated (09/14/2024)  Stress: No Stress Concern Present (09/14/2024)  Tobacco Use: Low Risk (09/15/2024)  Health Literacy: Adequate Health Literacy (09/15/2024)   See flowsheets for full screening details  Depression Screen PHQ 2 & 9 Depression Scale- Over the past 2 weeks, how often have you been bothered by any of the following problems? Little interest or pleasure in doing things: 0 Feeling down, depressed, or hopeless (PHQ Adolescent also includes...irritable): 1 PHQ-2 Total Score: 1 Trouble falling or staying asleep, or sleeping too much: 0 Feeling tired or having little energy: 0 Poor appetite or overeating (PHQ Adolescent also includes...weight loss): 0 Feeling bad about yourself - or that you are a failure or have let yourself or your family down: 0 Trouble concentrating on things, such as reading the newspaper or watching television (PHQ Adolescent also includes...like school work): 0 Moving or speaking so slowly that other people could have noticed. Or the opposite - being so fidgety or  restless that you have been moving around a lot more than usual: 0 Thoughts that you would be better off dead, or of hurting yourself in some way: 0 PHQ-9 Total Score: 1 If you checked off any problems, how difficult have these problems made it for you to do your work, take care of things at home, or get along with other people?: Not difficult at all  Depression Treatment Depression Interventions/Treatment : EYV7-0 Score <4 Follow-up Not  Indicated     Goals Addressed             This Visit's Progress    Patient Stated       09/15/2024, get cholesterol and sugar down.             Objective:    Today's Vitals   09/15/24 0809  Weight: 165 lb (74.8 kg)  Height: 5' 11 (1.803 m)   Body mass index is 23.01 kg/m.  Hearing/Vision screen Hearing Screening - Comments:: Denies hearing issues Vision Screening - Comments:: Regular eye exams, [employer] Immunizations and Health Maintenance Health Maintenance  Topic Date Due   COVID-19 Vaccine (1) Never done   Hepatitis C Screening  Never done   Zoster Vaccines- Shingrix  (2 of 2) 09/05/2024   Influenza Vaccine  01/02/2025 (Originally 05/05/2024)   Pneumococcal Vaccine: 50+ Years (1 of 1 - PCV) 07/11/2025 (Originally 02/28/2008)   Medicare Annual Wellness (AWV)  09/15/2025   DTaP/Tdap/Td (4 - Td or Tdap) 08/14/2026   Colonoscopy  02/04/2034   Meningococcal B Vaccine  Aged Out        Assessment/Plan:  This is a routine wellness examination for Elsie.  Patient Care Team: Sebastian Beverley NOVAK, MD as PCP - General (Family Medicine)  I have personally reviewed and noted the following in the patients chart:   Medical and social history Use of alcohol, tobacco or illicit drugs  Current medications and supplements including opioid prescriptions. Functional ability and status Nutritional status Physical activity Advanced directives List of other physicians Hospitalizations, surgeries, and ER visits in previous 12 months Vitals Screenings to include cognitive, depression, and falls Referrals and appointments  No orders of the defined types were placed in this encounter.  In addition, I have reviewed and discussed with patient certain preventive protocols, quality metrics, and best practice recommendations. A written personalized care plan for preventive services as well as general preventive health recommendations were provided to patient.   Ardella FORBES Dawn,  LPN   [id number]   Return in 1 year (on 09/15/2025).  After Visit Summary: (MyChart) Due to this being a telephonic visit, the after visit summary with patients personalized plan was offered to patient via MyChart   Nurse Notes: Vaccines not given: flu, covid and pneumonia declined today. Has an appointment for second shingles 12/06/2024. Screenings not ordered: Will discuss Hep C screening with PCP at next visit

## 2024-10-13 ENCOUNTER — Ambulatory Visit: Payer: Self-pay

## 2024-10-13 ENCOUNTER — Telehealth: Admitting: Family

## 2024-10-13 DIAGNOSIS — J329 Chronic sinusitis, unspecified: Secondary | ICD-10-CM | POA: Diagnosis not present

## 2024-10-13 DIAGNOSIS — R059 Cough, unspecified: Secondary | ICD-10-CM

## 2024-10-13 MED ORDER — AMOXICILLIN-POT CLAVULANATE 875-125 MG PO TABS: 1.0000 | ORAL_TABLET | Freq: Two times a day (BID) | ORAL | 0 refills | Status: AC

## 2024-10-13 MED ORDER — BENZONATATE 100 MG PO CAPS: 100.0000 mg | ORAL_CAPSULE | Freq: Three times a day (TID) | ORAL | 0 refills | Status: AC | PRN

## 2024-10-13 NOTE — Progress Notes (Unsigned)
 "   MyChart Video Visit    Virtual Visit via Video Note    Patient location: Home. Patient and provider in visit Provider location: Office  I discussed the limitations of evaluation and management by telemedicine and the availability of in person appointments. The patient expressed understanding and agreed to proceed.  Visit Date: 10/13/2024  Today's healthcare provider: Eleanor GORMAN Ponto, NP     Subjective:    Patient ID: Philip Crawford, male    DOB: 1955-10-31, 69 y.o.   MRN: [id number]  Chief Complaint  Patient presents with   Nasal Congestion   Cough    Onset - over 2 weeks -    Fever    Onset - today 10/13/24 - Fever over 100, along with chills & body aches - patient stated he has taken OTC medicines - Tylenol   Generalized Body Aches   Chills    HPI  Past Medical History:  Diagnosis Date   Allergy    seasonal   GERD (gastroesophageal reflux disease)    occasional   Sleep apnea     Past Surgical History:  Procedure Laterality Date   CARDIOLITE STRESS TEST  April thousand 6   Perfusion defect consistent with inferior apical ischemia coinciding with ischemic EKG changes on treadmill portion.  EF 58%.  Referred for cath --> FALSE POSITIVE   FRACTURE SURGERY  1972   HERNIA REPAIR  1961   LEFT HEART CATH AND CORONARY ANGIOGRAPHY  01/2005   Normal coronary arteries.  Normal LV gram.--FALSE POSITIVE CARDIOLITE STRESS TEST   VASCULAR SURGERY      Family History  Problem Relation Age of Onset   Other Mother        No CAD or diabetes   Arthritis Mother    Cancer Mother    Hypertension Mother    AAA (abdominal aortic aneurysm) Father 2       Died from ruptured AAA   CAD Father 23       CABG in his 81s   Cancer Father    Heart disease Father    Healthy Sister    Healthy Brother     Social History   Socioeconomic History   Marital status: Married    Spouse name: Not on file   Number of children: Not on file   Years of education: Not on file    Highest education level: Some college, no degree  Occupational History   Not on file  Tobacco Use   Smoking status: Never   Smokeless tobacco: Never  Vaping Use   Vaping status: Never Used  Substance and Sexual Activity   Alcohol use: Not Currently   Drug use: Never   Sexual activity: Yes  Other Topics Concern   Not on file  Social History Narrative   E COVID-19 restrictions again, he routinely went to the gym several days a week, exercising relatively vigorously.  Also enjoys riding a bike and walking.      He and his wife are suffering from the loss of their young adult daughter who succumbed to complications from schizophrenia and ended up committing suicide in the fall 2019.  As result this is because a significant amount of stress.      He is also been under significant stress and work as he is a tefl teacher and is struggling with the financial stresses of COVID-19 restrictions.   Social Drivers of Health   Tobacco Use: Low Risk (09/15/2024)   Patient History  Smoking Tobacco Use: Never    Smokeless Tobacco Use: Never    Passive Exposure: Not on file  Financial Resource Strain: Low Risk (09/14/2024)   Overall Financial Resource Strain (CARDIA)    Difficulty of Paying Living Expenses: Not hard at all  Food Insecurity: No Food Insecurity (09/14/2024)   Epic    Worried About Programme Researcher, Broadcasting/film/video in the Last Year: Never true    Ran Out of Food in the Last Year: Never true  Transportation Needs: No Transportation Needs (09/14/2024)   Epic    Lack of Transportation (Medical): No    Lack of Transportation (Non-Medical): No  Physical Activity: Sufficiently Active (09/14/2024)   Exercise Vital Sign    Days of Exercise per Week: 4 days    Minutes of Exercise per Session: 90 min  Stress: No Stress Concern Present (09/14/2024)   [employer] of Occupational Health - Occupational Stress Questionnaire    Feeling of Stress: Only a little  Social Connections:  Moderately Integrated (09/14/2024)   Social Connection and Isolation Panel    Frequency of Communication with Friends and Family: More than three times a week    Frequency of Social Gatherings with Friends and Family: Twice a week    Attends Religious Services: Never    Database Administrator or Organizations: Yes    Attends Banker Meetings: 1 to 4 times per year    Marital Status: Married  Catering Manager Violence: Not At Risk (09/15/2024)   Epic    Fear of Current or Ex-Partner: No    Emotionally Abused: No    Physically Abused: No    Sexually Abused: No  Depression (PHQ2-9): Low Risk (09/15/2024)   Depression (PHQ2-9)    PHQ-2 Score: 1  Alcohol Screen: Low Risk (09/14/2024)   Alcohol Screen    Last Alcohol Screening Score (AUDIT): 1  Housing: Low Risk (09/14/2024)   Epic    Unable to Pay for Housing in the Last Year: No    Number of Times Moved in the Last Year: 0    Homeless in the Last Year: No  Utilities: Not At Risk (09/15/2024)   Epic    Threatened with loss of utilities: No  Health Literacy: Adequate Health Literacy (09/15/2024)   B1300 Health Literacy    Frequency of need for help with medical instructions: Never    Outpatient Medications Prior to Visit  Medication Sig Dispense Refill   Coenzyme Q10 (Q-SORB CO Q-10) 100 MG capsule 1 capsule with a meal Orally Once a day occas     ketoconazole (NIZORAL) 2 % cream SMARTSIG:1 Application Topical 1 to 2 Times Daily     Magnesium 100 MG CAPS 1 tablet with a meal Orally Once a day     Omega-3 Fatty Acids (FISH OIL) 1200 MG CAPS 2 capules Orally Once a day     rosuvastatin  (CRESTOR ) 10 MG tablet Take 1 tablet (10 mg total) by mouth daily. 90 tablet 3   tadalafil  (CIALIS ) 5 MG tablet Take 1 tablet (5 mg total) by mouth daily. 90 tablet 3   Ubiquinol 100 MG CAPS Take 100 mg by mouth daily.     VITAMIN D , CHOLECALCIFEROL, PO Take 5,000 Units by mouth daily.     No facility-administered medications prior to  visit.    Allergies[1]  ROS     Objective:    Physical Exam  There were no vitals taken for this visit. Wt Readings from Last 3 Encounters:  09/15/24  165 lb (74.8 kg)  07/11/24 165 lb 9.6 oz (75.1 kg)  05/24/24 166 lb 12.8 oz (75.7 kg)       Assessment & Plan:   Problem List Items Addressed This Visit   None   I am having Philip Crawford. Philip Crawford maintain his (VITAMIN D , CHOLECALCIFEROL, PO), Ubiquinol, ketoconazole, Fish Oil, tadalafil , Magnesium, Q-Sorb Co Q-10, and rosuvastatin .  No orders of the defined types were placed in this encounter.   I discussed the assessment and treatment plan with the patient. The patient was provided an opportunity to ask questions and all were answered. The patient agreed with the plan and demonstrated an understanding of the instructions.   The patient was advised to call back or seek an in-person evaluation if the symptoms worsen or if the condition fails to improve as anticipated.  I provided *** minutes of face-to-face time during this encounter.   Eleanor GORMAN Ponto, NP [facility] [phone] (phone) [phone] (fax)  [facility]     [1]  Allergies Allergen Reactions   Dust Mite Extract Hives   Fluticasone    "

## 2024-10-13 NOTE — Telephone Encounter (Signed)
 FYI Only or Action Required?: FYI only for provider: appointment scheduled on 1/9.  Patient was last seen in primary care on 07/11/2024 by Sebastian Beverley NOVAK, MD.  Called Nurse Triage reporting Sinusitis.  Symptoms began several days ago.  Interventions attempted: OTC medications: Tylenol.  Symptoms are: rapidly worsening.  Triage Disposition: See HCP Within 4 Hours (Or PCP Triage)  Patient/caregiver understands and will follow disposition?: Yes       Message from Belfonte C sent at 10/13/2024 12:59 PM EST  Reason for Triage: Patient isnt feeling good, has been having a cough for a long time, has a real bad headache, low grade fever , pressure in sinus . Would like recommendations on what he needs   Reason for Disposition  [1] Redness or swelling on the cheek, forehead or around the eye AND [2] no fever    99.6 - 99.8 F during call  [1] SEVERE sinus pain (e.g., excruciating) AND [2] not improved 2 hours after pain medicine  Additional Information  Negative: [1] Difficulty breathing AND [2] not from stuffy nose (e.g., not relieved by cleaning out the nose)    Sinus Congestion, no wheezing  Negative: [1] SEVERE headache AND [2] fever    99.55F just a little bit ago, before taking Tylenol 30 minutes ago; 99.69F during call  Answer Assessment - Initial Assessment Questions LOCATION: Where does it hurt?      Sinus pressure, congestion, headache  ONSET: When did the sinus pain start?  (e.g., hours, days)      2 days after New Years Eve almost like got something else after having cold/cough that had been lingering  SEVERITY: How bad is the pain?   (Scale 0-10; or none, mild, moderate or severe)     Really bad yesterday, sinus headache, low grade fever, body aches; symptoms fluctuating at times  Body aches- annoying Headache- eye socket, teeth sharp, taking Tylenol, 8/10 after medication  RECURRENT SYMPTOM: Have you ever had sinus problems before? If Yes, ask: When was  the last time? and What happened that time?      History of sinus infections, this is as bad as it gets  NASAL CONGESTION: Is the nose blocked? If Yes, ask: Can you open it or must you breathe through your mouth?     Yes- very congested  FEVER: Do you have a fever? If Yes, ask: What is it, how was it measured, and when did it start?      Low-grade temps, 99.6 - 99.8 F during call (temporal)  OTHER SYMPTOMS: Do you have any other symptoms? (e.g., sore throat, cough, earache, difficulty breathing)     Cough/cold symptoms had been ongoing for a couple weeks prior to worsening after New Year's Eve, now reports feels like infection, denies difficulty breathing  Protocols used: Sinus Pain or Congestion-A-AH

## 2024-10-13 NOTE — Telephone Encounter (Signed)
 Patient scheduled 10/23/2024 not 10/13/2024.  FYI

## 2024-10-15 DIAGNOSIS — J329 Chronic sinusitis, unspecified: Secondary | ICD-10-CM | POA: Insufficient documentation

## 2024-10-15 NOTE — Assessment & Plan Note (Addendum)
 Suspected bacterial etiology due to severe sinus pressure, headache, and eye pain. He had illness over the past several weeks which seems to have advanced to sinusitis. - Prescribed Augmentin . - Advised nasal saline spray. - Recommended humidifier use. - Encouraged increased fluid intake.  Suspect he may have a superimposed influenza which is a newer illness for him.  Symptoms suggestive of influenza; home flu swab recommended for confirmation. It is not clear if he is in the 72 hour window for tamiflu initiation. - Advised flu A, B, and COVID swab at pharmacy. - Instructed to send MyChart message if flu swab positive. - Advised ER visit if fever >102F, shortness of breath, or worsening cough.

## 2024-10-15 NOTE — Patient Instructions (Signed)
" °  VISIT SUMMARY: During your visit, we discussed your recent symptoms of fever, chills, sinus pressure, and a persistent dry cough. You mentioned that your symptoms worsened significantly yesterday, with severe sinus pain and pressure in your eyes and teeth. We reviewed your history of pneumonia and previous COVID-19 infections. Based on your symptoms, we have made a few diagnoses and provided a treatment plan.  YOUR PLAN: -ACUTE BACTERIAL SINUSITIS: Acute bacterial sinusitis is an infection of the sinuses caused by bacteria, leading to severe sinus pressure, headache, and eye pain. We have prescribed Augmentin  to treat the infection, advised using nasal saline spray, recommended using a humidifier, and encouraged you to increase your fluid intake.  -COUGH: Your persistent dry cough has not responded to over-the-counter medications. We have prescribed Tessalon  capsules to help alleviate the cough.  -SUSPECTED INFLUENZA: Your symptoms are suggestive of influenza. We recommend getting a flu A, B, and COVID swab at the pharmacy to confirm the diagnosis. If the flu swab is positive, please send a message through MyChart. Continue taking Augmentin , and visit the emergency room if you experience a fever higher than 102F, shortness of breath, or worsening cough.  INSTRUCTIONS: Please follow the prescribed treatments and recommendations. Get the flu A, B, and COVID swab at the pharmacy and send a MyChart message if the flu swab is positive. Continue taking Augmentin  as prescribed. Visit the emergency room if you experience a fever higher than 102F, shortness of breath, or worsening cough.                                           "

## 2024-10-23 ENCOUNTER — Ambulatory Visit: Admitting: Family Medicine

## 2024-11-22 ENCOUNTER — Ambulatory Visit: Admitting: Family Medicine

## 2024-12-06 ENCOUNTER — Ambulatory Visit

## 2025-09-18 ENCOUNTER — Ambulatory Visit
